# Patient Record
Sex: Female | Born: 1985 | Race: White | Hispanic: No | Marital: Married | Smoking: Never smoker
Health system: Southern US, Community
[De-identification: ages and names within clinical notes are randomized; demographics above are authoritative.]

## PROBLEM LIST (undated history)

## (undated) ENCOUNTER — Inpatient Hospital Stay (HOSPITAL_COMMUNITY): Payer: Self-pay

## (undated) DIAGNOSIS — I7300 Raynaud's syndrome without gangrene: Secondary | ICD-10-CM

## (undated) DIAGNOSIS — R51 Headache: Secondary | ICD-10-CM

## (undated) DIAGNOSIS — Z789 Other specified health status: Secondary | ICD-10-CM

## (undated) HISTORY — DX: Raynaud's syndrome without gangrene: I73.00

## (undated) HISTORY — PX: TONSILLECTOMY AND ADENOIDECTOMY: SHX28

---

## 2012-12-06 LAB — OB RESULTS CONSOLE RPR: RPR: NONREACTIVE

## 2012-12-06 LAB — OB RESULTS CONSOLE ANTIBODY SCREEN: Antibody Screen: NEGATIVE

## 2012-12-11 LAB — OB RESULTS CONSOLE GC/CHLAMYDIA
Chlamydia: NEGATIVE
Gonorrhea: NEGATIVE

## 2012-12-17 ENCOUNTER — Inpatient Hospital Stay (HOSPITAL_COMMUNITY): Admission: AD | Admit: 2012-12-17 | Payer: Self-pay | Source: Ambulatory Visit | Admitting: Obstetrics & Gynecology

## 2013-06-10 ENCOUNTER — Encounter (HOSPITAL_COMMUNITY): Payer: Self-pay

## 2013-06-10 ENCOUNTER — Inpatient Hospital Stay (HOSPITAL_COMMUNITY): Payer: 59

## 2013-06-10 ENCOUNTER — Observation Stay (HOSPITAL_COMMUNITY)
Admission: AD | Admit: 2013-06-10 | Discharge: 2013-06-11 | Disposition: A | Discharge status: Home / Self Care | Payer: 59 | Source: Ambulatory Visit | Attending: Obstetrics & Gynecology | Admitting: Obstetrics & Gynecology

## 2013-06-10 DIAGNOSIS — Z9181 History of falling: Secondary | ICD-10-CM

## 2013-06-10 DIAGNOSIS — W19XXXD Unspecified fall, subsequent encounter: Secondary | ICD-10-CM

## 2013-06-10 DIAGNOSIS — O4700 False labor before 37 completed weeks of gestation, unspecified trimester: Principal | ICD-10-CM | POA: Insufficient documentation

## 2013-06-10 DIAGNOSIS — O99891 Other specified diseases and conditions complicating pregnancy: Secondary | ICD-10-CM | POA: Insufficient documentation

## 2013-06-10 DIAGNOSIS — Y9289 Other specified places as the place of occurrence of the external cause: Secondary | ICD-10-CM | POA: Insufficient documentation

## 2013-06-10 DIAGNOSIS — O4703 False labor before 37 completed weeks of gestation, third trimester: Secondary | ICD-10-CM

## 2013-06-10 DIAGNOSIS — W010XXA Fall on same level from slipping, tripping and stumbling without subsequent striking against object, initial encounter: Secondary | ICD-10-CM | POA: Insufficient documentation

## 2013-06-10 DIAGNOSIS — Y9302 Activity, running: Secondary | ICD-10-CM | POA: Insufficient documentation

## 2013-06-10 DIAGNOSIS — W1809XA Striking against other object with subsequent fall, initial encounter: Secondary | ICD-10-CM

## 2013-06-10 HISTORY — DX: Other specified health status: Z78.9

## 2013-06-10 LAB — CBC
HCT: 38.2 % (ref 36.0–46.0)
Hemoglobin: 13.2 g/dL (ref 12.0–15.0)
MCH: 32 pg (ref 26.0–34.0)
MCHC: 34.6 g/dL (ref 30.0–36.0)
MCV: 92.5 fL (ref 78.0–100.0)
Platelets: 161 K/uL (ref 150–400)
RBC: 4.13 MIL/uL (ref 3.87–5.11)
RDW: 13.4 % (ref 11.5–15.5)
WBC: 11.1 K/uL — ABNORMAL HIGH (ref 4.0–10.5)

## 2013-06-10 MED ORDER — LACTATED RINGERS IV SOLN
INTRAVENOUS | Status: DC
  Administered 2013-06-10 – 2013-06-11 (×2): via INTRAVENOUS

## 2013-06-10 MED ORDER — DOCUSATE SODIUM 100 MG PO CAPS
100.0000 mg | ORAL_CAPSULE | Freq: Every day | ORAL | Status: DC
  Filled 2013-06-10 (×2): qty 1

## 2013-06-10 MED ORDER — ACETAMINOPHEN 325 MG PO TABS: 650.0000 mg | ORAL_TABLET | ORAL | Status: DC | PRN

## 2013-06-10 MED ORDER — PRENATAL MULTIVITAMIN CH
1.0000 | ORAL_TABLET | Freq: Every day | ORAL | Status: DC
  Administered 2013-06-10: 1 via ORAL
  Filled 2013-06-10 (×2): qty 1

## 2013-06-10 MED ORDER — LACTATED RINGERS IV SOLN
Freq: Once | INTRAVENOUS | Status: AC
  Administered 2013-06-10: 15:00:00 via INTRAVENOUS

## 2013-06-10 MED ORDER — ZOLPIDEM TARTRATE 5 MG PO TABS: 5.0000 mg | ORAL_TABLET | Freq: Every evening | ORAL | Status: DC | PRN

## 2013-06-10 MED ORDER — CALCIUM CARBONATE ANTACID 500 MG PO CHEW
2.0000 | CHEWABLE_TABLET | ORAL | Status: DC | PRN
  Filled 2013-06-10: qty 2

## 2013-06-10 NOTE — Progress Notes (Signed)
Left message, awaiting a call back

## 2013-06-10 NOTE — Progress Notes (Signed)
Melanie bhambri cnm notified of patient, her ultrasound report and tracing including ctx pattern. Will come to see patient

## 2013-06-10 NOTE — H&P (Signed)
  OB ADMISSION/ HISTORY & PHYSICAL:  Admission Date: 06/10/2013  1:59 PM  Admit Diagnosis: 35.[redacted] weeks gestation, S/p fall  Lauren Burgess is a 26 y.o. female presenting for s/p fall around 1030 this am, abdomen made contact with concrete.  Prenatal History: G1P0   Prenatal care at [facility]  Primary Ob Provider: Dr. Seymour Bars Prenatal course uncomplicated   Medical / Surgical History :  Past medical history:  Past Medical History  Diagnosis Date  . Medical history non-contributory      Past surgical history:  Past Surgical History  Procedure Laterality Date  . No past surgeries    . Tonsillectomy      Family History: History reviewed. No pertinent family history.   Social History:  reports that she has never smoked. She does not have any smokeless tobacco history on file. Her alcohol and drug histories are not on file.  Allergies: Review of patient's allergies indicates no known allergies.   Current Medications at time of admission:  Prior to Admission medications   Medication Sig Start Date End Date Taking? Authorizing Provider  Prenatal Vit-Fe Fumarate-FA (PRENATAL MULTIVITAMIN) TABS tablet Take 1 tablet by mouth at bedtime.   Yes Historical Provider, MD     Review of Systems: +FM No LOF or VB +irregular, mild ctx   Physical Exam:  VS: Blood pressure 120/75, pulse 74, temperature 98.2 F (36.8 C), temperature source Oral, resp. rate 16, SpO2 96.00%.  General: alert and oriented, appears comfortable Heart: RRR Lungs: Clear lung fields Abdomen: Gravid, soft and non-tender, non-distended / uterus: gravid, non-tender Extremities: no edema  Genitalia / VE:  closed/30/-2  FHR: baseline rate 135 / variability mod / accelerations + / no decelerations TOCO: ctx q  1-4, mild Sono done: no evidence of abruption  Assessment: 35.[redacted] weeks gestation S/p fall Preterm contractions FHR category I   Plan:  Observation x24 hrs. Continuous  fetal monitoring. Monitor for signs of abruption. Dr Cherly Hensen notified of admission / plan of care   Lawernce Pitts MSN, CNM 06/10/2013, 6:03 PM

## 2013-06-10 NOTE — MAU Note (Signed)
Patient is in for monitoring due tripping and falling onto her abdomen while jogging this morning at 10.30am. She denies any pain, vaginal bleeding or LOF. She reports good fetal movement.

## 2013-06-10 NOTE — MAU Provider Note (Signed)
  History     CSN: [id number]  Arrival date and time: 06/10/13 1359   First Provider Initiated Contact with Patient 06/10/13 1720      Chief Complaint  Patient presents with  . Fall   HPI Comments: G1 @35 .2 was jogging with dog this am at 1030 and tripped, fell, braced herself with left hand but abdomen made contact with concrete. No LOF or VB. + FM since fall. Irregular ctx, not painful. Pregnancy uncomplicated.  Fall The accident occurred 6 to 12 hours ago. The fall occurred while running. She landed on concrete. There was no blood loss. The pain is at a severity of 0/10. The patient is experiencing no pain.    OB History   Grav Para Term Preterm Abortions TAB SAB Ect Mult Living   1               Past Medical History  Diagnosis Date  . Medical history non-contributory     Past Surgical History  Procedure Laterality Date  . No past surgeries    . Tonsillectomy      History reviewed. No pertinent family history.  History  Substance Use Topics  . Smoking status: Never Smoker   . Smokeless tobacco: Not on file  . Alcohol Use: Not on file    Allergies: No Known Allergies  Prescriptions prior to admission  Medication Sig Dispense Refill  . Prenatal Vit-Fe Fumarate-FA (PRENATAL MULTIVITAMIN) TABS tablet Take 1 tablet by mouth at bedtime.        Review of Systems  Constitutional: Negative.   HENT: Negative.   Eyes: Negative.   Respiratory: Negative.   Cardiovascular: Negative.   Gastrointestinal: Negative.   Genitourinary: Negative.   Musculoskeletal: Negative.   Skin: Negative.   Neurological: Negative.   Endo/Heme/Allergies: Negative.   Psychiatric/Behavioral: Negative.    Physical Exam   Blood pressure 120/75, pulse 74, temperature 98.2 F (36.8 C), temperature source Oral, resp. rate 16, SpO2 96.00%.  Physical Exam  Constitutional: She is oriented to person, place, and time. She appears well-developed and well-nourished.  HENT:  Head:  Normocephalic.  Eyes: Pupils are equal, round, and reactive to light.  Neck: Normal range of motion.  Cardiovascular: Normal rate and regular rhythm.   Respiratory: Effort normal and breath sounds normal.  GI: Soft. Bowel sounds are normal.  Genitourinary: Vagina normal and uterus normal.  Musculoskeletal: Normal range of motion.  Neurological: She is alert and oriented to person, place, and time. She has normal reflexes.  Skin: Skin is warm and dry.  Psychiatric: She has a normal mood and affect.  SVE: 0/30/-2 EFM: FHR 135 bpm, moderate variability, +accels, no decels; Toco: q 1-4, mild  MAU Course  Procedures NST-reactive Sono: Vertex, AFI-WNL; no evidence of abruption  MDM n/a  Assessment and Plan  35.[redacted] wks gestation S/p fall Preterm contractions Fetal Tracing: Cat I   Admit to antenatal unit  x24 hours for observation of contractions. Continuous Efm. Dr. Cherly Hensen consulted with assessment and plan of care.  Lauren Burgess, N 06/10/2013, 5:50 PM

## 2013-06-10 NOTE — Progress Notes (Signed)
Patient has continued to have contractions every 1-5 minutes, feels tightness, but no associated pain. Called M. Bhambri, CNM and updated. Provider is ok with uterine activity at this time. To call MD on call if patient begins having pain.

## 2013-06-10 NOTE — Progress Notes (Signed)
Left a message, 2nd call

## 2013-06-10 NOTE — Progress Notes (Signed)
Dr cousins notified of patient, order to obtain OB ultrasound and call Donette Larry cnm

## 2013-06-11 NOTE — Progress Notes (Signed)
HD#2  35 3/7 weeks S/P fall. Pt reports occ feels any of the ctxs. (+) FM. Denies abd pain  VSS Afebrile Lungs: clear to A Cor: RRR w/o murmur Abd: gravid nontender soft. No ecchymosis Pelvic: closed/ long/post/-2 vtx Ext: no edema  Tracing: baseline 120 (+) accels to 150 Ctx q 3-70mins  IMP: IUP @ 35 3/7 weeks S./P Fall with no evidence of placental abruption or labor P) d/c home. Keep scheduled  appt tomorrow. Labor prec

## 2013-06-11 NOTE — MAU Provider Note (Signed)
Reviewed and agree with plan.

## 2013-06-11 NOTE — Progress Notes (Signed)
Pt. Is stable and ready to be discharged. All belongings are with patient. All discharge instructions given and all questions answered. I escorted patient and she walked out with husband to be discharged.

## 2013-06-12 LAB — OB RESULTS CONSOLE GBS: GBS: POSITIVE

## 2013-07-09 ENCOUNTER — Telehealth (HOSPITAL_COMMUNITY): Payer: Self-pay | Admitting: *Deleted

## 2013-07-09 ENCOUNTER — Encounter (HOSPITAL_COMMUNITY): Payer: Self-pay | Admitting: *Deleted

## 2013-07-09 NOTE — Telephone Encounter (Signed)
Preadmission screen  

## 2013-07-12 ENCOUNTER — Other Ambulatory Visit: Payer: Self-pay | Admitting: Obstetrics & Gynecology

## 2013-07-18 HISTORY — PX: HAND SURGERY: SHX662

## 2013-07-18 NOTE — L&D Delivery Note (Signed)
Delivery Note At 1:46 PM a viable female was delivered via Vaginal, Spontaneous Delivery (Presentation: Left Occiput Anterior).  APGAR: 9, 9; weight .  pending Placenta status: Intact, Spontaneous.  Cord: 3 vessels with the following complications: None.  Cord pH: not indicated  Anesthesia: Epidural  Episiotomy: R mediolateral done given low fetal baseline, no progress over 20 min of pushing and loss of variability, suspected skin dystocia. One episitomy cut, excelled progress with pushing and delivery 2 contractions later.  Lacerations: 2nd degree Suture Repair: 2.0 3.0 vicryl rapide. Episiotomy closed in standard fashion w/ 30 vicrly rapide, 3 additional deep figure of 8 sutures used to better approximate tissue, with 2-0 vicrly rapide, prior to sub-Q closure Est. Blood Loss (mL): 400  Mom to postpartum.  Baby to Couplet care / Skin to Skin.  Zyrus Hetland A. 07/19/2013, 2:15 PM

## 2013-07-19 ENCOUNTER — Inpatient Hospital Stay (HOSPITAL_COMMUNITY)
Admission: RE | Admit: 2013-07-19 | Discharge: 2013-07-19 | Disposition: A | Discharge status: Home / Self Care | Payer: 59 | Source: Ambulatory Visit | Attending: Obstetrics | Admitting: Obstetrics

## 2013-07-19 ENCOUNTER — Encounter (HOSPITAL_COMMUNITY): Payer: 59 | Admitting: Anesthesiology

## 2013-07-19 ENCOUNTER — Inpatient Hospital Stay (HOSPITAL_COMMUNITY): Payer: 59 | Admitting: Anesthesiology

## 2013-07-19 ENCOUNTER — Inpatient Hospital Stay (HOSPITAL_COMMUNITY)
Admission: AD | Admit: 2013-07-19 | Discharge: 2013-07-21 | DRG: 775 | Disposition: A | Discharge status: Home / Self Care | Payer: 59 | Source: Ambulatory Visit | Attending: Obstetrics and Gynecology | Admitting: Obstetrics and Gynecology

## 2013-07-19 ENCOUNTER — Encounter (HOSPITAL_COMMUNITY): Payer: Self-pay | Admitting: *Deleted

## 2013-07-19 DIAGNOSIS — O9912 Other diseases of the blood and blood-forming organs and certain disorders involving the immune mechanism complicating childbirth: Secondary | ICD-10-CM

## 2013-07-19 DIAGNOSIS — D689 Coagulation defect, unspecified: Secondary | ICD-10-CM | POA: Diagnosis present

## 2013-07-19 DIAGNOSIS — O480 Post-term pregnancy: Principal | ICD-10-CM | POA: Diagnosis present

## 2013-07-19 DIAGNOSIS — Z3490 Encounter for supervision of normal pregnancy, unspecified, unspecified trimester: Secondary | ICD-10-CM

## 2013-07-19 DIAGNOSIS — D696 Thrombocytopenia, unspecified: Secondary | ICD-10-CM | POA: Diagnosis present

## 2013-07-19 DIAGNOSIS — O9989 Other specified diseases and conditions complicating pregnancy, childbirth and the puerperium: Secondary | ICD-10-CM

## 2013-07-19 DIAGNOSIS — I7300 Raynaud's syndrome without gangrene: Secondary | ICD-10-CM | POA: Diagnosis present

## 2013-07-19 DIAGNOSIS — Z22330 Carrier of Group B streptococcus: Secondary | ICD-10-CM

## 2013-07-19 DIAGNOSIS — O99892 Other specified diseases and conditions complicating childbirth: Secondary | ICD-10-CM | POA: Diagnosis present

## 2013-07-19 LAB — CBC
HCT: 38.3 % (ref 36.0–46.0)
HEMOGLOBIN: 13.4 g/dL (ref 12.0–15.0)
MCH: 32.3 pg (ref 26.0–34.0)
MCHC: 35 g/dL (ref 30.0–36.0)
MCV: 92.3 fL (ref 78.0–100.0)
Platelets: 129 10*3/uL — ABNORMAL LOW (ref 150–400)
RBC: 4.15 MIL/uL (ref 3.87–5.11)
RDW: 13.1 % (ref 11.5–15.5)
WBC: 10.6 10*3/uL — ABNORMAL HIGH (ref 4.0–10.5)

## 2013-07-19 LAB — ABO/RH: ABO/RH(D): O POS

## 2013-07-19 LAB — RPR: RPR: NONREACTIVE

## 2013-07-19 MED ORDER — ONDANSETRON HCL 4 MG PO TABS: 4.0000 mg | ORAL_TABLET | ORAL | Status: DC | PRN

## 2013-07-19 MED ORDER — DIBUCAINE 1 % RE OINT: 1.0000 "application " | TOPICAL_OINTMENT | RECTAL | Status: DC | PRN

## 2013-07-19 MED ORDER — OXYCODONE-ACETAMINOPHEN 5-325 MG PO TABS: 1.0000 | ORAL_TABLET | ORAL | Status: DC | PRN

## 2013-07-19 MED ORDER — EPHEDRINE 5 MG/ML INJ
10.0000 mg | INTRAVENOUS | Status: DC | PRN
  Filled 2013-07-19: qty 4
  Filled 2013-07-19: qty 2

## 2013-07-19 MED ORDER — OXYTOCIN 40 UNITS IN LACTATED RINGERS INFUSION - SIMPLE MED
1.0000 m[IU]/min | INTRAVENOUS | Status: DC
  Administered 2013-07-19: 2 m[IU]/min via INTRAVENOUS
  Filled 2013-07-19: qty 1000

## 2013-07-19 MED ORDER — CITRIC ACID-SODIUM CITRATE 334-500 MG/5ML PO SOLN: 30.0000 mL | ORAL | Status: DC | PRN

## 2013-07-19 MED ORDER — OXYTOCIN BOLUS FROM INFUSION: 500.0000 mL | INTRAVENOUS | Status: DC

## 2013-07-19 MED ORDER — FLEET ENEMA 7-19 GM/118ML RE ENEM: 1.0000 | ENEMA | Freq: Every day | RECTAL | Status: DC | PRN

## 2013-07-19 MED ORDER — DIPHENHYDRAMINE HCL 50 MG/ML IJ SOLN: 12.5000 mg | INTRAMUSCULAR | Status: DC | PRN

## 2013-07-19 MED ORDER — FENTANYL 2.5 MCG/ML BUPIVACAINE 1/10 % EPIDURAL INFUSION (WH - ANES)
14.0000 mL/h | INTRAMUSCULAR | Status: DC | PRN
  Filled 2013-07-19: qty 125

## 2013-07-19 MED ORDER — DIPHENHYDRAMINE HCL 25 MG PO CAPS: 25.0000 mg | ORAL_CAPSULE | Freq: Four times a day (QID) | ORAL | Status: DC | PRN

## 2013-07-19 MED ORDER — IBUPROFEN 600 MG PO TABS
600.0000 mg | ORAL_TABLET | Freq: Four times a day (QID) | ORAL | Status: DC
  Administered 2013-07-19 – 2013-07-21 (×8): 600 mg via ORAL
  Filled 2013-07-19 (×9): qty 1

## 2013-07-19 MED ORDER — SENNOSIDES-DOCUSATE SODIUM 8.6-50 MG PO TABS
2.0000 | ORAL_TABLET | ORAL | Status: DC
  Administered 2013-07-19 – 2013-07-21 (×2): 2 via ORAL
  Filled 2013-07-19 (×2): qty 2

## 2013-07-19 MED ORDER — PRENATAL MULTIVITAMIN CH
1.0000 | ORAL_TABLET | Freq: Every day | ORAL | Status: DC
  Administered 2013-07-20: 1 via ORAL
  Filled 2013-07-19 (×2): qty 1

## 2013-07-19 MED ORDER — LANOLIN HYDROUS EX OINT: TOPICAL_OINTMENT | CUTANEOUS | Status: DC | PRN

## 2013-07-19 MED ORDER — PHENYLEPHRINE 40 MCG/ML (10ML) SYRINGE FOR IV PUSH (FOR BLOOD PRESSURE SUPPORT)
80.0000 ug | PREFILLED_SYRINGE | INTRAVENOUS | Status: DC | PRN
  Filled 2013-07-19: qty 2
  Filled 2013-07-19: qty 10

## 2013-07-19 MED ORDER — LACTATED RINGERS IV SOLN: 500.0000 mL | INTRAVENOUS | Status: DC | PRN

## 2013-07-19 MED ORDER — FENTANYL 2.5 MCG/ML BUPIVACAINE 1/10 % EPIDURAL INFUSION (WH - ANES)
INTRAMUSCULAR | Status: DC | PRN
  Administered 2013-07-19: 14 mL/h via EPIDURAL

## 2013-07-19 MED ORDER — EPHEDRINE 5 MG/ML INJ
10.0000 mg | INTRAVENOUS | Status: DC | PRN
  Filled 2013-07-19: qty 2

## 2013-07-19 MED ORDER — LACTATED RINGERS IV SOLN
INTRAVENOUS | Status: DC
  Administered 2013-07-19: 13:00:00 via INTRAVENOUS

## 2013-07-19 MED ORDER — PENICILLIN G POTASSIUM 5000000 UNITS IJ SOLR
5.0000 10*6.[IU] | Freq: Once | INTRAVENOUS | Status: AC
  Administered 2013-07-19: 5 10*6.[IU] via INTRAVENOUS
  Filled 2013-07-19: qty 5

## 2013-07-19 MED ORDER — BISACODYL 10 MG RE SUPP: 10.0000 mg | Freq: Every day | RECTAL | Status: DC | PRN

## 2013-07-19 MED ORDER — SIMETHICONE 80 MG PO CHEW: 80.0000 mg | CHEWABLE_TABLET | ORAL | Status: DC | PRN

## 2013-07-19 MED ORDER — LACTATED RINGERS IV SOLN
500.0000 mL | Freq: Once | INTRAVENOUS | Status: AC
  Administered 2013-07-19: 500 mL via INTRAVENOUS

## 2013-07-19 MED ORDER — ONDANSETRON HCL 4 MG/2ML IJ SOLN: 4.0000 mg | Freq: Four times a day (QID) | INTRAMUSCULAR | Status: DC | PRN

## 2013-07-19 MED ORDER — PENICILLIN G POTASSIUM 5000000 UNITS IJ SOLR
2.5000 10*6.[IU] | INTRAVENOUS | Status: DC
  Administered 2013-07-19: 2.5 10*6.[IU] via INTRAVENOUS
  Filled 2013-07-19 (×5): qty 2.5

## 2013-07-19 MED ORDER — LIDOCAINE HCL (PF) 1 % IJ SOLN
30.0000 mL | INTRAMUSCULAR | Status: DC | PRN
  Administered 2013-07-19: 30 mL
  Filled 2013-07-19 (×2): qty 30

## 2013-07-19 MED ORDER — ACETAMINOPHEN 325 MG PO TABS: 650.0000 mg | ORAL_TABLET | ORAL | Status: DC | PRN

## 2013-07-19 MED ORDER — LIDOCAINE HCL (PF) 1 % IJ SOLN
INTRAMUSCULAR | Status: DC | PRN
  Administered 2013-07-19 (×2): 5 mL

## 2013-07-19 MED ORDER — ONDANSETRON HCL 4 MG/2ML IJ SOLN: 4.0000 mg | INTRAMUSCULAR | Status: DC | PRN

## 2013-07-19 MED ORDER — IBUPROFEN 600 MG PO TABS: 600.0000 mg | ORAL_TABLET | Freq: Four times a day (QID) | ORAL | Status: DC | PRN

## 2013-07-19 MED ORDER — ZOLPIDEM TARTRATE 5 MG PO TABS: 5.0000 mg | ORAL_TABLET | Freq: Every evening | ORAL | Status: DC | PRN

## 2013-07-19 MED ORDER — TETANUS-DIPHTH-ACELL PERTUSSIS 5-2.5-18.5 LF-MCG/0.5 IM SUSP: 0.5000 mL | Freq: Once | INTRAMUSCULAR | Status: DC

## 2013-07-19 MED ORDER — BENZOCAINE-MENTHOL 20-0.5 % EX AERO
1.0000 "application " | INHALATION_SPRAY | CUTANEOUS | Status: DC | PRN
  Administered 2013-07-19: 1 via TOPICAL
  Filled 2013-07-19: qty 56

## 2013-07-19 MED ORDER — PHENYLEPHRINE 40 MCG/ML (10ML) SYRINGE FOR IV PUSH (FOR BLOOD PRESSURE SUPPORT)
80.0000 ug | PREFILLED_SYRINGE | INTRAVENOUS | Status: DC | PRN
  Filled 2013-07-19: qty 10
  Filled 2013-07-19: qty 2

## 2013-07-19 MED ORDER — OXYTOCIN 40 UNITS IN LACTATED RINGERS INFUSION - SIMPLE MED
62.5000 mL/h | INTRAVENOUS | Status: DC
  Administered 2013-07-19: 62.5 mL/h via INTRAVENOUS

## 2013-07-19 MED ORDER — WITCH HAZEL-GLYCERIN EX PADS: 1.0000 "application " | MEDICATED_PAD | CUTANEOUS | Status: DC | PRN

## 2013-07-19 MED ORDER — TERBUTALINE SULFATE 1 MG/ML IJ SOLN: 0.2500 mg | Freq: Once | INTRAMUSCULAR | Status: DC | PRN

## 2013-07-19 NOTE — Progress Notes (Signed)
Lauren Burgess is a 28 y.o. G1P0 at [redacted]w[redacted]d by LMP admitted for induction of labor due to Post dates. Due date 12/27.  Subjective: Uncomfortable.  Objective: BP 138/85  Pulse 61  Temp(Src) 97.8 F (36.6 C) (Oral)  Resp 18  Ht 5\' 3"  (1.6 m)  Wt 61.236 kg (135 lb)  BMI 23.92 kg/m2      FHT:  FHR: 145 bpm, variability: moderate,  accelerations:  Present,  decelerations:  Absent UC:   regular, every 4 minutes SVE:   Dilation: 4 Effacement (%): 100 Station: -1 Exam by:: Quadir Muns AROM- clear  Labs: Lab Results  Component Value Date   WBC 11.1* 06/10/2013   HGB 13.2 06/10/2013   HCT 38.2 06/10/2013   MCV 92.5 06/10/2013   PLT 161 06/10/2013    Assessment / Plan: Induction of labor due to postterm,  progressing well on pitocin  Labor: Progressing normally Preeclampsia:  no signs or symptoms of toxicity Fetal Wellbeing:  Category I Pain Control:  Labor support without medications I/D:  n/a Anticipated MOD:  NSVD  Guy Seese J 07/19/2013, 9:20 AM

## 2013-07-19 NOTE — Anesthesia Preprocedure Evaluation (Signed)

## 2013-07-19 NOTE — H&P (Signed)
Lauren Burgess is a 28 y.o. G1P0 at [redacted]w[redacted]d presenting for post-dates IOL. Pt notes rare contractions. Good fetal movement, No vaginal bleeding, not leaking fluid.  PNCare at [facility] since 1st trimester. - GBS pos - thrombocytopenia, stable in low 100;s   Prenatal Transfer Tool  Maternal Diabetes: No Genetic Screening: Normal Maternal Ultrasounds/Referrals: Normal Fetal Ultrasounds or other Referrals:  None Maternal Substance Abuse:  No Significant Maternal Medications:  None Significant Maternal Lab Results: None     OB History   Grav Para Term Preterm Abortions TAB SAB Ect Mult Living   1              Past Medical History  Diagnosis Date  . Medical history non-contributory   . Raynaud disease    Past Surgical History  Procedure Laterality Date  . No past surgeries    . Tonsillectomy     Family History: family history includes Cancer in her maternal grandfather and maternal grandmother; Heart attack in her maternal grandfather; Hypertension in her maternal grandfather. Social History:  reports that she has never smoked. She has never used smokeless tobacco. She reports that she does not drink alcohol or use illicit drugs.  Review of Systems - Negative except discomfort of pregnancy   Dilation: 10 Effacement (%): 100 Station: +3 Exam by:: [email or web address],WU Blood pressure 96/54, pulse 56, temperature 97.8 F (36.6 C), temperature source Oral, resp. rate 18, height 5\' 3"  (1.6 m), weight 61.236 kg (135 lb), SpO2 100.00%.  Physical Exam: pt not seen by me upon admission- exam reflects at time of first encounter Gen: well appearing, no distress, pushing with contractions  Abd: gravid, NT, no RUQ pain LE: no edema, equal bilaterally, non-tender Toco: q 3-5 min, pit at 1munits/ min FH: baseline 110's, accelerations present, no deceleratons, 10 beat variability  Prenatal labs: ABO, Rh: O/Positive/-- (05/22 0000) Antibody: Negative (05/22 0000) Rubella:   immune RPR: Nonreactive (05/22 0000)  HBsAg: Negative (05/22 0000)  HIV: Non-reactive (05/22 0000)  GBS: Positive (11/26 0000)  1 hr Glucola per OB flow  Genetic screening per OB flow Anatomy US nl   Assessment/Plan: 28 y.o. G1P0 at [redacted]w[redacted]d IOL for post-dates. S/p AROM/ pitocin- great progress, expect delivery Reactive fetal testing Epidural   Lauren Burgess A. 07/19/2013, 2:11 PM

## 2013-07-19 NOTE — Anesthesia Procedure Notes (Signed)
Epidural Patient location during procedure: OB Start time: 07/19/2013 9:44 AM  Staffing Anesthesiologist: Rudean Curt Performed by: anesthesiologist   Preanesthetic Checklist Completed: patient identified, site marked, surgical consent, pre-op evaluation, timeout performed, IV checked, risks and benefits discussed and monitors and equipment checked  Epidural Patient position: sitting Prep: site prepped and draped and DuraPrep Patient monitoring: continuous pulse ox and blood pressure Approach: midline Injection technique: LOR air  Needle:  Needle type: Tuohy  Needle gauge: 17 G Needle length: 9 cm and 9 Needle insertion depth: 5 cm cm Catheter type: closed end flexible Catheter size: 19 Gauge Catheter at skin depth: 10 cm Test dose: negative  Assessment Events: blood not aspirated, injection not painful, no injection resistance, negative IV test and no paresthesia  Additional Notes Patient identified.  Risk benefits discussed including failed block, incomplete pain control, headache, nerve damage, paralysis, blood pressure changes, nausea, vomiting, reactions to medication both toxic or allergic, and postpartum back pain.  Patient expressed understanding and wished to proceed.  All questions were answered.  Sterile technique used throughout procedure and epidural site dressed with sterile barrier dressing. No paresthesia or other complications noted.The patient did not experience any signs of intravascular injection such as tinnitus or metallic taste in mouth nor signs of intrathecal spread such as rapid motor block. Please see nursing notes for vital signs.

## 2013-07-19 NOTE — Lactation Note (Signed)
This note was copied from the chart of [place]. Lactation Consultation Note  Patient Name: Boy Aleana Fifita [id number] Date: 07/19/2013 Reason for consult: Initial assessment;Difficult latch;Breast/nipple pain on (L) nipple due to shallow latch.  (L) nipple inflamed but everted and after breastfeeding, nipple remains intact and symmetrical.  Mom reports a soreness level of "4" on this side when assisted with deeper latch.  LC demonstrated hand expression, cross-cradle and chin tug technique to ensure deeper latch and wide areolar grasp.  FOB also shown how to assist and baby nursed with several re-latches for total of 12 minutes.  Swallows heard at intervals. 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  LC encouraged cue feedings, STS and use of chin tug as needed, especially on (L) side.  Assessment and interventions reviewed with RN's, Janett Billow and Dianna.  LC provided comfort gelpads for use on nipples between feedings after applying ebm. LC encouraged review of Baby and Me pp 9, 14 and 20-25 for STS and BF information. LC provided [employer] brochure and reviewed [facility] services and list of community and web site resources.      Maternal Data Formula Feeding for Exclusion: No Infant to breast within first hour of birth: Yes (initial LATCH score=7 due to needing help and no swallows noted) Has patient been taught Hand Expression?: Yes (LC reviewed and assisted with latch) Does the patient have breastfeeding experience prior to this delivery?: No  Feeding Feeding Type: Breast Fed Length of feed: 12 min  LATCH Score/Interventions Latch: Grasps breast easily, tongue down, lips flanged, rhythmical sucking. (opens wider with less nipple discomfort w/chin tug) Intervention(s): Adjust position;Assist with latch;Breast compression  Audible Swallowing: A few with stimulation Intervention(s): Alternate breast  massage;Hand expression;Skin to skin  Type of Nipple: Everted at rest and after stimulation  Comfort (Breast/Nipple): Filling, red/small blisters or bruises, mild/mod discomfort  Problem noted: Severe discomfort;Mild/Moderate discomfort (improved soreness with deeper latch) Interventions (Mild/moderate discomfort): Hand expression;Comfort gels  Hold (Positioning): Assistance needed to correctly position infant at breast and maintain latch. Intervention(s): Breastfeeding basics reviewed;Support Pillows;Position options;Skin to skin (demonstrated chin tug and cross-cradle)  LATCH Score: 7 (LC assist and later, mom re-latched on her own)  Lactation Tools Discussed/Used Tools: Comfort gels Pump Review: Milk Storage Initiated by:: provided by insurance carrier and received from [facility] store Date initiated::  (no need for pumping at this time)   Consult Status Consult Status: Follow-up Date: 07/20/13 Follow-up type: In-patient    Junious Dresser [facility] 07/19/2013, 11:23 PM

## 2013-07-20 LAB — CBC
HEMATOCRIT: 33.8 % — AB (ref 36.0–46.0)
Hemoglobin: 11.7 g/dL — ABNORMAL LOW (ref 12.0–15.0)
MCH: 32.2 pg (ref 26.0–34.0)
MCHC: 34.6 g/dL (ref 30.0–36.0)
MCV: 93.1 fL (ref 78.0–100.0)
PLATELETS: 114 10*3/uL — AB (ref 150–400)
RBC: 3.63 MIL/uL — ABNORMAL LOW (ref 3.87–5.11)
RDW: 13.1 % (ref 11.5–15.5)
WBC: 10.9 10*3/uL — ABNORMAL HIGH (ref 4.0–10.5)

## 2013-07-20 NOTE — Anesthesia Postprocedure Evaluation (Signed)
  Anesthesia Post-op Note  Patient: Lauren Burgess  Procedure(s) Performed: * No procedures listed *  Patient Location: PACU and Mother/Baby  Anesthesia Type:Epidural  Level of Consciousness: awake, alert  and oriented  Airway and Oxygen Therapy: Patient Spontanous Breathing  Post-op Pain: none  Post-op Assessment: Post-op Vital signs reviewed, Patient's Cardiovascular Status Stable, No headache, No backache, No residual numbness and No residual motor weakness  Post-op Vital Signs: Reviewed and stable  Complications: No apparent anesthesia complications

## 2013-07-20 NOTE — Progress Notes (Signed)
PPD 1 SVD  S:  Reports feeling well - tired and sore/ tailbone especially sore             Tolerating po/ No nausea or vomiting             Bleeding is light             Pain controlled with motrin and percocet             Up ad lib / ambulatory without limitation or pain in sacrum /voiding QS  Newborn breast feeding  / Circumcision planne O:               VS: BP 108/72  Pulse 52  Temp(Src) 98.1 F (36.7 C) (Oral)  Resp 18  Ht 5\' 3"  (1.6 m)  Wt 61.236 kg (135 lb)  BMI 23.92 kg/m2  SpO2 100%   LABS:              Recent Labs  07/19/13 0705 07/20/13 0640  WBC 10.6* 10.9*  HGB 13.4 11.7*  PLT 129* 114*               Blood type: --/--/O POS (01/02 8413)  Rubella: Immune (05/22 0000)                     I&O: Intake/Output     01/02 0701 - 01/03 0700 01/03 0701 - 01/04 0700   Urine (mL/kg/hr) 400 (0.3)    Blood 400 (0.3)    Total Output 800     Net -800                        Physical Exam:             Alert and oriented X3  Lungs: Clear and unlabored  Heart: regular rate and rhythm / no mumurs  Abdomen: soft, non-tender, non-distended              Fundus: firm, non-tender, U-1  Perineum: moderate edema - ice pack in place  Lochia: light  Extremities: no edema, no calf pain or tenderness    A: PPD # 1              Tailbone pain - no mobility limitation  Doing well - stable status  P: Routine post partum orders  Instructed to avoid prolonged sitting on tailbone to reduce pressure and dependent edema to site                        Ice to area x 24 hours then low heat topically to sacral region / use NSAIDS routinely x 1 week                        Will need evaluation if pain worsens or not improving in 1 week - instructed to call office then            [place], [place], MSN, [facility], [place] 07/20/2013, 11:08 AM

## 2013-07-21 MED ORDER — IBUPROFEN 600 MG PO TABS: 600.0000 mg | ORAL_TABLET | Freq: Four times a day (QID) | ORAL | Status: DC

## 2013-07-21 MED ORDER — OXYCODONE-ACETAMINOPHEN 5-325 MG PO TABS
1.0000 | ORAL_TABLET | ORAL | Status: DC | PRN
Start: 2013-07-21 — End: 2013-12-10

## 2013-07-21 NOTE — Progress Notes (Signed)
PPD 2 SVD  S:  Reports feeling better today - swelling down and pain in tailbone improving - just sore             Tolerating po/ No nausea or vomiting             Bleeding is light             Pain controlled with motrin and percocet             Up ad lib / ambulatory / voiding QS  Newborn breast feeding  / Circumcision planned today O:               VS: BP 103/64  Pulse 49  Temp(Src) 98.2 F (36.8 C) (Oral)  Resp 18  Ht 5\' 3"  (1.6 m)  Wt 61.236 kg (135 lb)  BMI 23.92 kg/m2  SpO2 100%              Physical Exam:             Alert and oriented X3  Abdomen: soft, non-tender, non-distended              Fundus: firm, non-tender, U-2  Perineum: decreased edema  Lochia: light-spotting  Extremities: no edema, no calf pain or tenderness    A: PPD # 2   Doing well - stable status  P: Routine post partum orders  DC home after newborn circ today             WOB booklet - instructions reviewed to call  Artelia Laroche CNM, MSN, [facility] 07/21/2013, 10:13 AM

## 2013-07-27 NOTE — Discharge Summary (Signed)
DISCHARGE SUMMARY:  Patient ID: Lauren Burgess MRN: [id number] DOB/AGE: 1985-10-05 28 y.o.  Admit date: 07/19/2013 Admission Diagnoses: labor induction - post dates / thrombocytopenia  Discharge date: 07/21/2013  Discharge Diagnoses: PPD 2 s/p SVD  Prenatal history: G1P1001   EDC : 07/13/2013, by Other Basis  Prenatal care at [facility]  Primary provider : Pamala Burgess Prenatal course complicated with + GBS / thrombocytopenia / post dates  Prenatal Labs: ABO, Rh: --/--/O POS (01/02 3474)  Antibody: Negative (05/22 0000) Rubella: Immune (05/22 0000)   RPR: NON REACTIVE (01/02 0705)  HBsAg: Negative (05/22 0000)  HIV: Non-reactive (05/22 0000)  GTT : NL GBS: Positive (11/26 0000)   Medical / Surgical History :  Past medical history:  Past Medical History  Diagnosis Date  . Medical history non-contributory   . Raynaud disease     Past surgical history:  Past Surgical History  Procedure Laterality Date  . No past surgeries    . Tonsillectomy      Family History:  Family History  Problem Relation Age of Onset  . Cancer Maternal Grandmother     breast  . Cancer Maternal Grandfather     prostate, pancreatic  . Heart attack Maternal Grandfather   . Hypertension Maternal Grandfather     Social History:  reports that she has never smoked. She has never used smokeless tobacco. She reports that she does not drink alcohol or use illicit drugs.  Allergies: Review of patient's allergies indicates no known allergies.   Current Medications at time of admission:  Prior to Admission medications   Medication Sig Start Date End Date Taking? Authorizing Provider  Prenatal Vit-Fe Fumarate-FA (PRENATAL MULTIVITAMIN) TABS tablet Take 1 tablet by mouth at bedtime.   Yes Historical Provider, MD     Intrapartum Course:  Admit for induction labor with labor progression to complete dilation with normal labor curve Pain management: epidural Complicated by: low FHR  baseline Interventions required: episiotomy necessary to facilitate delivery  Procedures: Cesarean section delivery on 07/19/2013 with delivery of  female newborn by Dr Lauren Burgess  See operative report for further details APGAR (1 MIN): 9   APGAR (5 MINS): 9    Postoperative / postpartum course:   discharge on PPD 2 with some moderate pain in coccyx and rectum   Physical Exam:   General: ambulatory Heart: RRR Lungs: clear  Abdomen: soft and non-tender / non-distended / active BS  Extremities: no edema / negative Homans  Fundus firm and 1 F below perineum:  Mild edema / no erythema / no ecchymosis / no drainage  Discharge Instructions:  Discharged Condition: stable  Activity: pelvic rest and postoperative restrictions x 2   Diet: routine  Medications:    Medication List         ibuprofen 600 MG tablet  Commonly known as:  ADVIL,MOTRIN  Take 1 tablet (600 mg total) by mouth every 6 (six) hours.     oxyCODONE-acetaminophen 5-325 MG per tablet  Commonly known as:  PERCOCET/ROXICET  Take 1 tablet by mouth every 4 (four) hours as needed for severe pain (moderate - severe pain).     prenatal multivitamin Tabs tablet  Take 1 tablet by mouth at bedtime.        Postpartum Instructions: [facility] discharge booklet - instructions reviewed  Discharge to: Home  Follow up :   [facility] in 6 weeks for routine postpartum visit with Lauren Burgess  Signed: Artelia Laroche CNM, MSN, [place] 07/27/2013, 2:38 PM  Please note ERROR above- pt did not have cesarean delivery. Pt had SVD after episiotomy cut.   Tisa Weisel A. 07/28/2013 8:28 PM

## 2013-08-09 ENCOUNTER — Ambulatory Visit (HOSPITAL_COMMUNITY)
Admission: RE | Admit: 2013-08-09 | Discharge: 2013-08-09 | Disposition: A | Discharge status: Home / Self Care | Payer: 59 | Source: Ambulatory Visit | Attending: Obstetrics and Gynecology | Admitting: Obstetrics and Gynecology

## 2013-08-09 NOTE — Lactation Note (Signed)
Adult Lactation Consultation Outpatient Visit Note: Mom here for evaluation of latch due to sore nipple on left breast. Reports crack near tip of nipple that hurts especially at the initial latch. Both nipples slightly pink, Left nipple with crack as mom reported.  Patient Name: Lauren Burgess Date of Birth: 13-Dec-1985 Gestational Age at Delivery: Unknown Type of Delivery:   Breastfeeding History: Frequency of Breastfeeding: Q 2-3 hrs- one 4 hour stretch at night Length of Feeding: 15-30 Voids: QS- had void while here for appointment Stools: QS  Supplementing / Method: Pumping:  Type of Pump:   Frequency:  Volume:    Comments:    Consultation Evaluation:  Initial Feeding Assessment: Pre-feed Weight: 8- 13.4  4008g Post-feed Weight: 8- 15.5  4068g Amount Transferred: 60 Comments: Mom reports that the right breast feels the fullest. Mom doing great job with latch to this breast. Elijah latched well and nursed for 10 minutes with lots of swallows noted.   Additional Feeding Assessment: Pre-feed Weight: 8- 15.5 4068g Post-feed Weight: 9- 0.5  4098g Amount Transferred:30 Comments: Elijah latched well but needed bottom lip adjusted. After adjustment mom reports that it feels fine. Mom has been doing more nursing on the right breast so this breast isn't as full. After 5 minutes Elijah begins wiggling and pulling at the breast. Unlatched him and burped him again. Offered that breast again but he would not latch on.   Total Breast milk Transferred this Visit: 90 Total Supplement Given: 0  Additional Interventions: Mom has been using lanolin. Offered comfort gels for healing-mom agreed. Reviewed instructions for use of comfort gels. Going back to work when baby is 85 months old. Asking about pumping and bottle feeding EBM- Reviewed pumping schedule and how to introduce a bottle. No further questions at present. To call prn  Follow-Up  With Ped    Truddie Crumble 08/09/2013,  9:33 AM

## 2013-12-10 ENCOUNTER — Encounter (HOSPITAL_COMMUNITY): Payer: Self-pay | Admitting: Pharmacy Technician

## 2013-12-11 ENCOUNTER — Other Ambulatory Visit: Payer: Self-pay | Admitting: Orthopedic Surgery

## 2013-12-12 ENCOUNTER — Encounter (HOSPITAL_COMMUNITY): Payer: Self-pay | Admitting: *Deleted

## 2013-12-12 MED ORDER — CHLORHEXIDINE GLUCONATE 4 % EX LIQD
60.0000 mL | Freq: Once | CUTANEOUS | Status: DC
  Filled 2013-12-12: qty 60

## 2013-12-12 MED ORDER — CEFAZOLIN SODIUM-DEXTROSE 2-3 GM-% IV SOLR
2.0000 g | INTRAVENOUS | Status: AC
  Administered 2013-12-13: 2 g via INTRAVENOUS
  Filled 2013-12-12: qty 50

## 2013-12-12 NOTE — Progress Notes (Signed)
Pt made aware to Stop taking Aspirin,vitamins and herbal medications. Do not take any NSAIDs ie: Ibuprofen, Advil, Naproxen or any medication containing Aspirin.

## 2013-12-13 ENCOUNTER — Encounter (HOSPITAL_COMMUNITY): Payer: Self-pay | Admitting: *Deleted

## 2013-12-13 ENCOUNTER — Ambulatory Visit (HOSPITAL_COMMUNITY)
Admission: RE | Admit: 2013-12-13 | Discharge: 2013-12-13 | Disposition: A | Discharge status: Home / Self Care | Payer: 59 | Source: Ambulatory Visit | Attending: Orthopedic Surgery | Admitting: Orthopedic Surgery

## 2013-12-13 ENCOUNTER — Encounter (HOSPITAL_COMMUNITY): Payer: 59 | Admitting: Anesthesiology

## 2013-12-13 ENCOUNTER — Ambulatory Visit (HOSPITAL_COMMUNITY): Payer: 59 | Admitting: Anesthesiology

## 2013-12-13 ENCOUNTER — Encounter (HOSPITAL_COMMUNITY)
Admission: RE | Disposition: A | Discharge status: Home / Self Care | Payer: Self-pay | Source: Ambulatory Visit | Attending: Orthopedic Surgery

## 2013-12-13 DIAGNOSIS — I7300 Raynaud's syndrome without gangrene: Secondary | ICD-10-CM | POA: Insufficient documentation

## 2013-12-13 DIAGNOSIS — S65509A Unspecified injury of blood vessel of unspecified finger, initial encounter: Secondary | ICD-10-CM | POA: Insufficient documentation

## 2013-12-13 DIAGNOSIS — IMO0002 Reserved for concepts with insufficient information to code with codable children: Secondary | ICD-10-CM

## 2013-12-13 DIAGNOSIS — I739 Peripheral vascular disease, unspecified: Secondary | ICD-10-CM | POA: Insufficient documentation

## 2013-12-13 DIAGNOSIS — S61209A Unspecified open wound of unspecified finger without damage to nail, initial encounter: Secondary | ICD-10-CM | POA: Insufficient documentation

## 2013-12-13 DIAGNOSIS — Z79899 Other long term (current) drug therapy: Secondary | ICD-10-CM | POA: Insufficient documentation

## 2013-12-13 DIAGNOSIS — R51 Headache: Secondary | ICD-10-CM | POA: Insufficient documentation

## 2013-12-13 DIAGNOSIS — X58XXXA Exposure to other specified factors, initial encounter: Secondary | ICD-10-CM | POA: Insufficient documentation

## 2013-12-13 HISTORY — DX: Headache: R51

## 2013-12-13 HISTORY — PX: WOUND EXPLORATION: SHX6188

## 2013-12-13 LAB — CBC
HEMATOCRIT: 40.3 % (ref 36.0–46.0)
Hemoglobin: 13.6 g/dL (ref 12.0–15.0)
MCH: 31.6 pg (ref 26.0–34.0)
MCHC: 33.7 g/dL (ref 30.0–36.0)
MCV: 93.7 fL (ref 78.0–100.0)
Platelets: 187 10*3/uL (ref 150–400)
RBC: 4.3 MIL/uL (ref 3.87–5.11)
RDW: 13.3 % (ref 11.5–15.5)
WBC: 5.9 10*3/uL (ref 4.0–10.5)

## 2013-12-13 SURGERY — WOUND EXPLORATION
Anesthesia: General | Site: Hand | Laterality: Left

## 2013-12-13 MED ORDER — HYDROMORPHONE HCL PF 1 MG/ML IJ SOLN
0.2500 mg | INTRAMUSCULAR | Status: DC | PRN
Start: 2013-12-13 — End: 2013-12-13
  Administered 2013-12-13: 0.25 mg via INTRAVENOUS

## 2013-12-13 MED ORDER — FENTANYL CITRATE 0.05 MG/ML IJ SOLN
INTRAMUSCULAR | Status: AC
  Filled 2013-12-13: qty 5

## 2013-12-13 MED ORDER — OXYCODONE HCL 5 MG PO TABS
5.0000 mg | ORAL_TABLET | Freq: Once | ORAL | Status: AC | PRN
  Administered 2013-12-13: 5 mg via ORAL

## 2013-12-13 MED ORDER — ONDANSETRON HCL 4 MG/2ML IJ SOLN
INTRAMUSCULAR | Status: AC
  Filled 2013-12-13: qty 2

## 2013-12-13 MED ORDER — PROPOFOL 10 MG/ML IV BOLUS
INTRAVENOUS | Status: DC | PRN
  Administered 2013-12-13: 200 mg via INTRAVENOUS

## 2013-12-13 MED ORDER — BUPIVACAINE HCL (PF) 0.25 % IJ SOLN
INTRAMUSCULAR | Status: AC
  Filled 2013-12-13: qty 30

## 2013-12-13 MED ORDER — SODIUM CHLORIDE 0.9 % IR SOLN
Status: DC | PRN
  Administered 2013-12-13: 1000 mL

## 2013-12-13 MED ORDER — PROMETHAZINE HCL 25 MG/ML IJ SOLN: 6.2500 mg | INTRAMUSCULAR | Status: DC | PRN

## 2013-12-13 MED ORDER — BUPIVACAINE HCL (PF) 0.25 % IJ SOLN
INTRAMUSCULAR | Status: DC | PRN
  Administered 2013-12-13: 30 mL

## 2013-12-13 MED ORDER — LIDOCAINE HCL (CARDIAC) 20 MG/ML IV SOLN
INTRAVENOUS | Status: AC
  Filled 2013-12-13: qty 5

## 2013-12-13 MED ORDER — ONDANSETRON HCL 4 MG/2ML IJ SOLN
INTRAMUSCULAR | Status: DC | PRN
  Administered 2013-12-13: 4 mg via INTRAVENOUS

## 2013-12-13 MED ORDER — HYDROMORPHONE HCL PF 1 MG/ML IJ SOLN
INTRAMUSCULAR | Status: AC
  Administered 2013-12-13: 0.25 mg via INTRAVENOUS
  Filled 2013-12-13: qty 1

## 2013-12-13 MED ORDER — LACTATED RINGERS IV SOLN
INTRAVENOUS | Status: DC | PRN
  Administered 2013-12-13: 08:00:00 via INTRAVENOUS

## 2013-12-13 MED ORDER — LACTATED RINGERS IV SOLN
INTRAVENOUS | Status: DC
  Administered 2013-12-13: 08:00:00 via INTRAVENOUS

## 2013-12-13 MED ORDER — PROPOFOL 10 MG/ML IV BOLUS
INTRAVENOUS | Status: AC
  Filled 2013-12-13: qty 20

## 2013-12-13 MED ORDER — OXYCODONE-ACETAMINOPHEN 5-325 MG PO TABS: 1.0000 | ORAL_TABLET | ORAL | Status: DC | PRN

## 2013-12-13 MED ORDER — FENTANYL CITRATE 0.05 MG/ML IJ SOLN
INTRAMUSCULAR | Status: DC | PRN
  Administered 2013-12-13 (×2): 50 ug via INTRAVENOUS

## 2013-12-13 MED ORDER — OXYCODONE HCL 5 MG/5ML PO SOLN: 5.0000 mg | Freq: Once | ORAL | Status: AC | PRN

## 2013-12-13 MED ORDER — MIDAZOLAM HCL 5 MG/5ML IJ SOLN
INTRAMUSCULAR | Status: DC | PRN
  Administered 2013-12-13: 1 mg via INTRAVENOUS

## 2013-12-13 MED ORDER — LIDOCAINE HCL (CARDIAC) 20 MG/ML IV SOLN
INTRAVENOUS | Status: DC | PRN
  Administered 2013-12-13: 80 mg via INTRAVENOUS

## 2013-12-13 MED ORDER — DEXTROSE 5 % IV SOLN
INTRAVENOUS | Status: DC | PRN
  Administered 2013-12-13: 09:00:00 via INTRAVENOUS

## 2013-12-13 MED ORDER — MIDAZOLAM HCL 2 MG/2ML IJ SOLN
INTRAMUSCULAR | Status: AC
  Filled 2013-12-13: qty 2

## 2013-12-13 MED ORDER — OXYCODONE HCL 5 MG PO TABS
ORAL_TABLET | ORAL | Status: AC
  Administered 2013-12-13: 5 mg via ORAL
  Filled 2013-12-13: qty 1

## 2013-12-13 SURGICAL SUPPLY — 51 items
BANDAGE ELASTIC 3 VELCRO ST LF (GAUZE/BANDAGES/DRESSINGS) ×3 IMPLANT
BANDAGE ELASTIC 4 VELCRO ST LF (GAUZE/BANDAGES/DRESSINGS) ×3 IMPLANT
BANDAGE GAUZE ELAST BULKY 4 IN (GAUZE/BANDAGES/DRESSINGS) ×3 IMPLANT
BNDG CMPR 9X4 STRL LF SNTH (GAUZE/BANDAGES/DRESSINGS) ×1
BNDG ESMARK 4X9 LF (GAUZE/BANDAGES/DRESSINGS) ×3 IMPLANT
CORDS BIPOLAR (ELECTRODE) ×3 IMPLANT
COVER SURGICAL LIGHT HANDLE (MISCELLANEOUS) ×6 IMPLANT
CUFF TOURNIQUET SINGLE 18IN (TOURNIQUET CUFF) ×3 IMPLANT
DRAPE SURG 17X23 STRL (DRAPES) ×3 IMPLANT
DRSG PAD ABDOMINAL 8X10 ST (GAUZE/BANDAGES/DRESSINGS) ×6 IMPLANT
DURAPREP 26ML APPLICATOR (WOUND CARE) ×3 IMPLANT
GAUZE XEROFORM 1X8 LF (GAUZE/BANDAGES/DRESSINGS) ×3 IMPLANT
GLOVE BIO SURGEON STRL SZ7.5 (GLOVE) ×3 IMPLANT
GLOVE BIO SURGEON STRL SZ8.5 (GLOVE) ×3 IMPLANT
GLOVE BIOGEL PI IND STRL 7.0 (GLOVE) ×3 IMPLANT
GLOVE BIOGEL PI IND STRL 7.5 (GLOVE) ×1 IMPLANT
GLOVE BIOGEL PI INDICATOR 7.0 (GLOVE) ×6
GLOVE BIOGEL PI INDICATOR 7.5 (GLOVE) ×2
GLOVE SURG SS PI 7.0 STRL IVOR (GLOVE) ×9 IMPLANT
GOWN STRL REUS W/ TWL LRG LVL3 (GOWN DISPOSABLE) ×3 IMPLANT
GOWN STRL REUS W/ TWL XL LVL3 (GOWN DISPOSABLE) ×1 IMPLANT
GOWN STRL REUS W/TWL LRG LVL3 (GOWN DISPOSABLE) ×9
GOWN STRL REUS W/TWL XL LVL3 (GOWN DISPOSABLE) ×3
KIT BASIN OR (CUSTOM PROCEDURE TRAY) ×3 IMPLANT
KIT ROOM TURNOVER OR (KITS) ×3 IMPLANT
NEEDLE HYPO 25GX1X1/2 BEV (NEEDLE) ×3 IMPLANT
NEEDLE HYPO 25X1 1.5 SAFETY (NEEDLE) ×3 IMPLANT
NS IRRIG 1000ML POUR BTL (IV SOLUTION) ×3 IMPLANT
PACK ORTHO EXTREMITY (CUSTOM PROCEDURE TRAY) ×3 IMPLANT
PAD ARMBOARD 7.5X6 YLW CONV (MISCELLANEOUS) ×6 IMPLANT
PAD CAST 4YDX4 CTTN HI CHSV (CAST SUPPLIES) ×2 IMPLANT
PADDING CAST ABS 4INX4YD NS (CAST SUPPLIES) ×2
PADDING CAST ABS COTTON 4X4 ST (CAST SUPPLIES) ×1 IMPLANT
PADDING CAST COTTON 4X4 STRL (CAST SUPPLIES) ×6
PROTEXIS PI GLOVE ×9 IMPLANT
SPLINT PLASTER EXTRA FAST 3X15 (CAST SUPPLIES) ×2
SPLINT PLASTER GYPS XFAST 3X15 (CAST SUPPLIES) ×1 IMPLANT
SPONGE GAUZE 4X4 12PLY (GAUZE/BANDAGES/DRESSINGS) ×3 IMPLANT
SPONGE LAP 18X18 X RAY DECT (DISPOSABLE) ×3 IMPLANT
SUCTION FRAZIER TIP 10 FR DISP (SUCTIONS) ×3 IMPLANT
SUT ETHILON 4 0 PS 2 18 (SUTURE) ×3 IMPLANT
SUT ETHILON 9 0 BV130 4 (SUTURE) ×3 IMPLANT
SUT VICRYL RAPIDE 4/0 PS 2 (SUTURE) ×3 IMPLANT
SYR 20CC LL (SYRINGE) IMPLANT
SYR CONTROL 10ML LL (SYRINGE) ×3 IMPLANT
TOWEL OR 17X24 6PK STRL BLUE (TOWEL DISPOSABLE) ×3 IMPLANT
TOWEL OR 17X26 10 PK STRL BLUE (TOWEL DISPOSABLE) ×3 IMPLANT
TUBE CONNECTING 12'X1/4 (SUCTIONS) ×1
TUBE CONNECTING 12X1/4 (SUCTIONS) ×2 IMPLANT
UNDERPAD 30X30 INCONTINENT (UNDERPADS AND DIAPERS) ×3 IMPLANT
YANKAUER SUCT BULB TIP NO VENT (SUCTIONS) ×3 IMPLANT

## 2013-12-13 NOTE — H&P (Signed)
Lauren Burgess is an 28 y.o. female.   Chief Complaint: left hand numbness HPI: as above s/p volar laceration left hand  Past Medical History  Diagnosis Date  . Medical history non-contributory   . Raynaud disease   . OIZTIWPY(099.8)     Past Surgical History  Procedure Laterality Date  . No past surgeries    . Tonsillectomy    . Tonsillectomy and adenoidectomy      Family History  Problem Relation Age of Onset  . Cancer Maternal Grandmother     breast  . Cancer Maternal Grandfather     prostate, pancreatic  . Heart attack Maternal Grandfather   . Hypertension Maternal Grandfather    Social History:  reports that she has never smoked. She has never used smokeless tobacco. She reports that she drinks alcohol. She reports that she does not use illicit drugs.  Allergies: No Known Allergies  Medications Prior to Admission  Medication Sig Dispense Refill  . cephALEXin (KEFLEX) 500 MG capsule Take 500 mg by mouth 4 (four) times daily.      Marland Kitchen ibuprofen (ADVIL,MOTRIN) 200 MG tablet Take 200-400 mg by mouth every 6 (six) hours as needed for headache.      . Prenatal Vit-Fe Fumarate-FA (PRENATAL MULTIVITAMIN) TABS tablet Take 1 tablet by mouth at bedtime.        No results found for this or any previous visit (from the past 48 hour(s)). No results found.  Review of Systems  All other systems reviewed and are negative.   Blood pressure 109/55, pulse 58, temperature 97.7 F (36.5 C), temperature source Oral, resp. rate 20, height 5\' 3"  (1.6 m), weight 52.164 kg (115 lb), SpO2 100.00%, unknown if currently breastfeeding. Physical Exam  Constitutional: She is oriented to person, place, and time. She appears well-developed and well-nourished.  HENT:  Head: Normocephalic and atraumatic.  Cardiovascular: Normal rate.   Respiratory: Effort normal.  Musculoskeletal:       Left hand: She exhibits tenderness and laceration. Decreased sensation noted. Decreased sensation is  present in the medial distribution.  Volar hand laceration with decreased sensation in long and ring fingers  Neurological: She is alert and oriented to person, place, and time.  Skin: Skin is warm.  Psychiatric: She has a normal mood and affect. Her behavior is normal. Judgment and thought content normal.     Assessment/Plan As above  Plan explore and repair as needed Schuyler Amor 12/13/2013, 7:16 AM

## 2013-12-13 NOTE — Anesthesia Preprocedure Evaluation (Signed)
Anesthesia Evaluation  Patient identified by MRN, date of birth, ID band Patient awake    Reviewed: Allergy & Precautions, H&P , NPO status , Patient's Chart, lab work & pertinent test results  Airway Mallampati: I TM Distance: >3 FB Neck ROM: full    Dental  (+) Teeth Intact, Dental Advidsory Given   Pulmonary neg pulmonary ROS,  breath sounds clear to auscultation        Cardiovascular + Peripheral Vascular Disease Rhythm:regular Rate:Normal  Raynaud's disease   Neuro/Psych  Headaches, negative psych ROS   GI/Hepatic negative GI ROS, Neg liver ROS,   Endo/Other  negative endocrine ROS  Renal/GU negative Renal ROS     Musculoskeletal   Abdominal   Peds  Hematology   Anesthesia Other Findings Pt Breastfeeding, would like a list of medicines given during surgery.  Advised not to breast feed for 24 hours after surgery.  Reproductive/Obstetrics negative OB ROS                           Anesthesia Physical Anesthesia Plan  ASA: II  Anesthesia Plan: General LMA   Post-op Pain Management:    Induction:   Airway Management Planned:   Additional Equipment:   Intra-op Plan:   Post-operative Plan:   Informed Consent: I have reviewed the patients History and Physical, chart, labs and discussed the procedure including the risks, benefits and alternatives for the proposed anesthesia with the patient or authorized representative who has indicated his/her understanding and acceptance.   Dental Advisory Given  Plan Discussed with: Anesthesiologist, CRNA and Surgeon  Anesthesia Plan Comments:         Anesthesia Quick Evaluation

## 2013-12-13 NOTE — Anesthesia Procedure Notes (Signed)
Procedure Name: LMA Insertion Date/Time: 12/13/2013 8:54 AM Performed by: Williemae Area B Pre-anesthesia Checklist: Patient identified, Emergency Drugs available, Suction available and Patient being monitored Patient Re-evaluated:Patient Re-evaluated prior to inductionOxygen Delivery Method: Circle system utilized Preoxygenation: Pre-oxygenation with 100% oxygen Intubation Type: IV induction Ventilation: Mask ventilation without difficulty LMA: LMA inserted LMA Size: 3.0 Number of attempts: 2 (Size 4 LMA would not go into mouth) Placement Confirmation: breath sounds checked- equal and bilateral and positive ETCO2 Tube secured with: taped across cheeks; gauze roll b/t teeth. Dental Injury: Teeth and Oropharynx as per pre-operative assessment

## 2013-12-13 NOTE — Anesthesia Postprocedure Evaluation (Signed)
Anesthesia Post Note  Patient: Lauren Burgess  Procedure(s) Performed: Procedure(s) (LRB): EXPLORATION AND REPAIR OF COMMON DIGITAL ARTERY AND NERVE. NEURLYSIS OF COMMON DIGITAL NERVE. (Left)  Anesthesia type: general  Patient location: PACU  Post pain: Pain level controlled  Post assessment: Patient's Cardiovascular Status Stable  Last Vitals:  Filed Vitals:   12/13/13 1045  BP: 106/72  Pulse: 54  Temp:   Resp: 14    Post vital signs: Reviewed and stable  Level of consciousness: sedated  Complications: No apparent anesthesia complications

## 2013-12-13 NOTE — Op Note (Signed)
See note [phone]

## 2013-12-13 NOTE — Transfer of Care (Signed)
Immediate Anesthesia Transfer of Care Note  Patient: Lauren Burgess  Procedure(s) Performed: Procedure(s): EXPLORATION AND REPAIR OF COMMON DIGITAL ARTERY AND NERVE. NEURLYSIS OF COMMON DIGITAL NERVE. (Left)  Patient Location: PACU  Anesthesia Type:General  Level of Consciousness: awake, alert  and patient cooperative  Airway & Oxygen Therapy: Patient Spontanous Breathing and Patient connected to nasal cannula oxygen  Post-op Assessment: Report given to PACU RN, Post -op Vital signs reviewed and stable and Patient moving all extremities  Post vital signs: Reviewed and stable  Complications: No apparent anesthesia complications

## 2013-12-14 NOTE — Op Note (Signed)
NAME:  Lauren Burgess, Lauren Burgess            ACCOUNT NO.:  [id number]  MEDICAL RECORD NO.:  [id number]  LOCATION:  [facility]                         FACILITY:  [facility]  PHYSICIAN:  Sheral Apley. Shemiah Rosch, M.D.DATE OF BIRTH:  09/30/1985  DATE OF PROCEDURE:  12/13/2013 DATE OF DISCHARGE:  12/13/2013                              OPERATIVE REPORT   PREOPERATIVE DIAGNOSIS:  Deep laceration, palmar aspect, left hand.  POSTOPERATIVE DIAGNOSIS:  Deep laceration, palmar aspect, left hand.  PROCEDURE:  Aspiration of above with primary repair of common digital artery to long and ring finger and neurolysis of common digital nerve to long and ring finger, microscopically.  SURGEON:  Sheral Apley. Burney Gauze, M.D.  ASSISTANT:  None.  ANESTHESIA:  General.  COMPLICATION:  None.  DRAINS:  None.  DESCRIPTION OF PROCEDURE:  The patient was taken to the operating suite. After induction of general anesthesia, left upper extremity was prepped and draped in sterile fashion.  An Esmarch was used to exsanguinate the limb.  Tourniquet was inflated to 250 mmHg.  At this point in time, a laceration in the webspace between the long and ring fingers was done proximally and distally in gentle Z-fashion, distal radial and proximal ulnar.  Dissection was carried down to the long finger and ring finger in the metacarpal area.  There was a 9%  laceration of the common digital artery to the long and ring web as well as significant scarring of the common digital nerve at bifurcation to the long and ring fingers. Under macroscopic magnification, we dissected the area out and then brought the microscope onto the field.  Under microscopic magnification, we did complete neurolysis of the common digital nerve of the long and ring out to the branches terminally then under microscopic magnification, we debrided the artery ends of adventitia, used the lacrimal duct dilators to dilate the vessels both proximally and distally until flow was  evident.  We then irrigated and repaired using three 9-0 nylon sutures in a triangular type pattern.  The wound was then irrigated and loosely closed with 4-0 Vicryl Rapide.  Xeroform, 4x4s, and a dorsal extension block splint was applied.  The patient tolerated the procedure well in concealed fashion.     Sheral Apley Burney Gauze, M.D.     MAW/MEDQ  D:  12/13/2013  T:  12/14/2013  Job:  [id number]

## 2013-12-16 ENCOUNTER — Encounter (HOSPITAL_COMMUNITY): Payer: Self-pay | Admitting: Orthopedic Surgery

## 2014-05-19 ENCOUNTER — Encounter (HOSPITAL_COMMUNITY): Payer: Self-pay | Admitting: Orthopedic Surgery

## 2015-06-25 LAB — OB RESULTS CONSOLE HIV ANTIBODY (ROUTINE TESTING): HIV: NONREACTIVE

## 2015-06-25 LAB — OB RESULTS CONSOLE ANTIBODY SCREEN: ANTIBODY SCREEN: NEGATIVE

## 2015-06-25 LAB — OB RESULTS CONSOLE ABO/RH: RH TYPE: POSITIVE

## 2015-06-25 LAB — OB RESULTS CONSOLE GC/CHLAMYDIA
CHLAMYDIA, DNA PROBE: NEGATIVE
GC PROBE AMP, GENITAL: NEGATIVE

## 2015-06-25 LAB — OB RESULTS CONSOLE HEPATITIS B SURFACE ANTIGEN: Hepatitis B Surface Ag: NEGATIVE

## 2015-06-25 LAB — OB RESULTS CONSOLE RUBELLA ANTIBODY, IGM: Rubella: IMMUNE

## 2015-06-25 LAB — OB RESULTS CONSOLE RPR: RPR: NONREACTIVE

## 2015-07-19 NOTE — L&D Delivery Note (Signed)
Delivery Note At 12:23 PM a viable and healthy female was delivered via Vaginal, Spontaneous Delivery (Presentation: Right Occiput Anterior).  APGAR: 9, 9; weight pending .   Placenta status: Intact, Spontaneous.  Cord: 3 vessels with the following complications: None.  Cord pH: na  Anesthesia: Epidural  Episiotomy: None Lacerations: 2nd degree;Perineal Suture Repair: 2.0 vicryl rapide Est. Blood Loss (mL):  100  Mom to postpartum.  Baby to Couplet care / Skin to Skin.  Natosha Bou J 02/03/2016, 1:01 PM

## 2015-07-24 DIAGNOSIS — Z3490 Encounter for supervision of normal pregnancy, unspecified, unspecified trimester: Secondary | ICD-10-CM | POA: Diagnosis not present

## 2015-07-24 DIAGNOSIS — Z3481 Encounter for supervision of other normal pregnancy, first trimester: Secondary | ICD-10-CM | POA: Diagnosis not present

## 2015-08-17 DIAGNOSIS — R3911 Hesitancy of micturition: Secondary | ICD-10-CM | POA: Diagnosis not present

## 2015-08-21 DIAGNOSIS — Z36 Encounter for antenatal screening of mother: Secondary | ICD-10-CM | POA: Diagnosis not present

## 2015-08-21 DIAGNOSIS — Z3482 Encounter for supervision of other normal pregnancy, second trimester: Secondary | ICD-10-CM | POA: Diagnosis not present

## 2015-09-03 DIAGNOSIS — Z3482 Encounter for supervision of other normal pregnancy, second trimester: Secondary | ICD-10-CM | POA: Diagnosis not present

## 2015-09-03 DIAGNOSIS — Z36 Encounter for antenatal screening of mother: Secondary | ICD-10-CM | POA: Diagnosis not present

## 2015-11-05 DIAGNOSIS — Z23 Encounter for immunization: Secondary | ICD-10-CM | POA: Diagnosis not present

## 2015-11-05 DIAGNOSIS — Z36 Encounter for antenatal screening of mother: Secondary | ICD-10-CM | POA: Diagnosis not present

## 2015-11-19 DIAGNOSIS — Z3483 Encounter for supervision of other normal pregnancy, third trimester: Secondary | ICD-10-CM | POA: Diagnosis not present

## 2015-11-19 DIAGNOSIS — Z36 Encounter for antenatal screening of mother: Secondary | ICD-10-CM | POA: Diagnosis not present

## 2015-12-30 DIAGNOSIS — Z3483 Encounter for supervision of other normal pregnancy, third trimester: Secondary | ICD-10-CM | POA: Diagnosis not present

## 2015-12-30 LAB — OB RESULTS CONSOLE GBS: STREP GROUP B AG: POSITIVE

## 2016-01-15 DIAGNOSIS — Z3483 Encounter for supervision of other normal pregnancy, third trimester: Secondary | ICD-10-CM | POA: Diagnosis not present

## 2016-01-15 DIAGNOSIS — Z36 Encounter for antenatal screening of mother: Secondary | ICD-10-CM | POA: Diagnosis not present

## 2016-02-01 ENCOUNTER — Encounter (HOSPITAL_COMMUNITY): Payer: Self-pay | Admitting: *Deleted

## 2016-02-01 ENCOUNTER — Other Ambulatory Visit: Payer: Self-pay | Admitting: Obstetrics & Gynecology

## 2016-02-01 ENCOUNTER — Telehealth (HOSPITAL_COMMUNITY): Payer: Self-pay | Admitting: *Deleted

## 2016-02-01 NOTE — Telephone Encounter (Signed)
Preadmission screen  

## 2016-02-03 ENCOUNTER — Inpatient Hospital Stay (HOSPITAL_COMMUNITY)
Admission: AD | Admit: 2016-02-03 | Discharge: 2016-02-05 | DRG: 775 | Disposition: A | Discharge status: Home / Self Care | Payer: 59 | Source: Ambulatory Visit | Attending: Obstetrics | Admitting: Obstetrics

## 2016-02-03 ENCOUNTER — Inpatient Hospital Stay (HOSPITAL_COMMUNITY): Payer: 59 | Admitting: Anesthesiology

## 2016-02-03 ENCOUNTER — Encounter (HOSPITAL_COMMUNITY): Payer: Self-pay | Admitting: *Deleted

## 2016-02-03 DIAGNOSIS — I7300 Raynaud's syndrome without gangrene: Secondary | ICD-10-CM | POA: Diagnosis present

## 2016-02-03 DIAGNOSIS — Z3483 Encounter for supervision of other normal pregnancy, third trimester: Secondary | ICD-10-CM | POA: Diagnosis present

## 2016-02-03 DIAGNOSIS — O99824 Streptococcus B carrier state complicating childbirth: Secondary | ICD-10-CM | POA: Diagnosis present

## 2016-02-03 DIAGNOSIS — Z8249 Family history of ischemic heart disease and other diseases of the circulatory system: Secondary | ICD-10-CM | POA: Diagnosis not present

## 2016-02-03 DIAGNOSIS — Z3A39 39 weeks gestation of pregnancy: Secondary | ICD-10-CM

## 2016-02-03 DIAGNOSIS — N898 Other specified noninflammatory disorders of vagina: Secondary | ICD-10-CM | POA: Diagnosis not present

## 2016-02-03 DIAGNOSIS — Z3490 Encounter for supervision of normal pregnancy, unspecified, unspecified trimester: Secondary | ICD-10-CM

## 2016-02-03 LAB — CBC
HCT: 38.4 % (ref 36.0–46.0)
Hemoglobin: 13.6 g/dL (ref 12.0–15.0)
MCH: 32.2 pg (ref 26.0–34.0)
MCHC: 35.4 g/dL (ref 30.0–36.0)
MCV: 91 fL (ref 78.0–100.0)
Platelets: 161 K/uL (ref 150–400)
RBC: 4.22 MIL/uL (ref 3.87–5.11)
RDW: 13.5 % (ref 11.5–15.5)
WBC: 10.1 K/uL (ref 4.0–10.5)

## 2016-02-03 LAB — TYPE AND SCREEN
ABO/RH(D): O POS
Antibody Screen: NEGATIVE

## 2016-02-03 MED ORDER — EPHEDRINE 5 MG/ML INJ
10.0000 mg | INTRAVENOUS | Status: DC | PRN
  Filled 2016-02-03: qty 2

## 2016-02-03 MED ORDER — PENICILLIN G POTASSIUM 5000000 UNITS IJ SOLR: 2.5000 10*6.[IU] | INTRAVENOUS | Status: DC

## 2016-02-03 MED ORDER — LACTATED RINGERS IV SOLN: 500.0000 mL | INTRAVENOUS | Status: DC | PRN

## 2016-02-03 MED ORDER — ONDANSETRON HCL 4 MG/2ML IJ SOLN: 4.0000 mg | Freq: Four times a day (QID) | INTRAMUSCULAR | Status: DC | PRN

## 2016-02-03 MED ORDER — ONDANSETRON HCL 4 MG PO TABS: 4.0000 mg | ORAL_TABLET | ORAL | Status: DC | PRN

## 2016-02-03 MED ORDER — METHYLERGONOVINE MALEATE 0.2 MG PO TABS: 0.2000 mg | ORAL_TABLET | ORAL | Status: DC | PRN

## 2016-02-03 MED ORDER — PHENYLEPHRINE 40 MCG/ML (10ML) SYRINGE FOR IV PUSH (FOR BLOOD PRESSURE SUPPORT)
80.0000 ug | PREFILLED_SYRINGE | INTRAVENOUS | Status: DC | PRN
  Filled 2016-02-03: qty 5

## 2016-02-03 MED ORDER — FENTANYL 2.5 MCG/ML BUPIVACAINE 1/10 % EPIDURAL INFUSION (WH - ANES)
INTRAMUSCULAR | Status: AC
  Filled 2016-02-03: qty 125

## 2016-02-03 MED ORDER — OXYCODONE-ACETAMINOPHEN 5-325 MG PO TABS: 1.0000 | ORAL_TABLET | ORAL | Status: DC | PRN

## 2016-02-03 MED ORDER — COCONUT OIL OIL: 1.0000 "application " | TOPICAL_OIL | Status: DC | PRN

## 2016-02-03 MED ORDER — METHYLERGONOVINE MALEATE 0.2 MG/ML IJ SOLN: 0.2000 mg | INTRAMUSCULAR | Status: DC | PRN

## 2016-02-03 MED ORDER — ACETAMINOPHEN 325 MG PO TABS: 650.0000 mg | ORAL_TABLET | ORAL | Status: DC | PRN

## 2016-02-03 MED ORDER — SENNOSIDES-DOCUSATE SODIUM 8.6-50 MG PO TABS
2.0000 | ORAL_TABLET | ORAL | Status: DC
  Administered 2016-02-05: 2 via ORAL
  Filled 2016-02-03: qty 2

## 2016-02-03 MED ORDER — SOD CITRATE-CITRIC ACID 500-334 MG/5ML PO SOLN: 30.0000 mL | ORAL | Status: DC | PRN

## 2016-02-03 MED ORDER — DIPHENHYDRAMINE HCL 25 MG PO CAPS: 25.0000 mg | ORAL_CAPSULE | Freq: Four times a day (QID) | ORAL | Status: DC | PRN

## 2016-02-03 MED ORDER — PHENYLEPHRINE 40 MCG/ML (10ML) SYRINGE FOR IV PUSH (FOR BLOOD PRESSURE SUPPORT)
PREFILLED_SYRINGE | INTRAVENOUS | Status: AC
  Filled 2016-02-03: qty 20

## 2016-02-03 MED ORDER — ONDANSETRON HCL 4 MG/2ML IJ SOLN: 4.0000 mg | INTRAMUSCULAR | Status: DC | PRN

## 2016-02-03 MED ORDER — SODIUM CHLORIDE 0.9 % IV SOLN
2.0000 g | Freq: Four times a day (QID) | INTRAVENOUS | Status: DC
  Administered 2016-02-03: 2 g via INTRAVENOUS
  Filled 2016-02-03 (×3): qty 2000

## 2016-02-03 MED ORDER — DIBUCAINE 1 % RE OINT: 1.0000 "application " | TOPICAL_OINTMENT | RECTAL | Status: DC | PRN

## 2016-02-03 MED ORDER — ZOLPIDEM TARTRATE 5 MG PO TABS: 5.0000 mg | ORAL_TABLET | Freq: Every evening | ORAL | Status: DC | PRN

## 2016-02-03 MED ORDER — FENTANYL 2.5 MCG/ML BUPIVACAINE 1/10 % EPIDURAL INFUSION (WH - ANES)
14.0000 mL/h | INTRAMUSCULAR | Status: DC | PRN
  Administered 2016-02-03 (×2): 14 mL/h via EPIDURAL

## 2016-02-03 MED ORDER — SIMETHICONE 80 MG PO CHEW: 80.0000 mg | CHEWABLE_TABLET | ORAL | Status: DC | PRN

## 2016-02-03 MED ORDER — LACTATED RINGERS IV SOLN
500.0000 mL | Freq: Once | INTRAVENOUS | Status: AC
  Administered 2016-02-03: 11:00:00 via INTRAVENOUS

## 2016-02-03 MED ORDER — LIDOCAINE HCL (PF) 1 % IJ SOLN
INTRAMUSCULAR | Status: DC | PRN
  Administered 2016-02-03 (×2): 5 mL via EPIDURAL

## 2016-02-03 MED ORDER — BENZOCAINE-MENTHOL 20-0.5 % EX AERO
1.0000 "application " | INHALATION_SPRAY | CUTANEOUS | Status: DC | PRN
  Administered 2016-02-03: 1 via TOPICAL
  Filled 2016-02-03: qty 56

## 2016-02-03 MED ORDER — TETANUS-DIPHTH-ACELL PERTUSSIS 5-2.5-18.5 LF-MCG/0.5 IM SUSP: 0.5000 mL | Freq: Once | INTRAMUSCULAR | Status: DC

## 2016-02-03 MED ORDER — PRENATAL MULTIVITAMIN CH
1.0000 | ORAL_TABLET | Freq: Every day | ORAL | Status: DC
  Administered 2016-02-04 – 2016-02-05 (×2): 1 via ORAL
  Filled 2016-02-03 (×2): qty 1

## 2016-02-03 MED ORDER — LIDOCAINE HCL (PF) 1 % IJ SOLN
30.0000 mL | INTRAMUSCULAR | Status: DC | PRN
  Filled 2016-02-03: qty 30

## 2016-02-03 MED ORDER — DIPHENHYDRAMINE HCL 50 MG/ML IJ SOLN: 12.5000 mg | INTRAMUSCULAR | Status: DC | PRN

## 2016-02-03 MED ORDER — LACTATED RINGERS IV SOLN
INTRAVENOUS | Status: DC
  Administered 2016-02-03: 11:00:00 via INTRAVENOUS

## 2016-02-03 MED ORDER — WITCH HAZEL-GLYCERIN EX PADS: 1.0000 "application " | MEDICATED_PAD | CUTANEOUS | Status: DC | PRN

## 2016-02-03 MED ORDER — OXYTOCIN BOLUS FROM INFUSION
500.0000 mL | INTRAVENOUS | Status: DC
  Administered 2016-02-03: 500 mL via INTRAVENOUS

## 2016-02-03 MED ORDER — OXYCODONE-ACETAMINOPHEN 5-325 MG PO TABS: 2.0000 | ORAL_TABLET | ORAL | Status: DC | PRN

## 2016-02-03 MED ORDER — OXYTOCIN 40 UNITS IN LACTATED RINGERS INFUSION - SIMPLE MED
2.5000 [IU]/h | INTRAVENOUS | Status: DC
  Administered 2016-02-03: 2.5 [IU]/h via INTRAVENOUS
  Filled 2016-02-03: qty 1000

## 2016-02-03 MED ORDER — IBUPROFEN 600 MG PO TABS
600.0000 mg | ORAL_TABLET | Freq: Four times a day (QID) | ORAL | Status: DC
  Administered 2016-02-03 – 2016-02-05 (×8): 600 mg via ORAL
  Filled 2016-02-03 (×8): qty 1

## 2016-02-03 MED ORDER — DOCUSATE SODIUM 100 MG PO CAPS
100.0000 mg | ORAL_CAPSULE | Freq: Two times a day (BID) | ORAL | Status: DC
  Administered 2016-02-03 – 2016-02-05 (×4): 100 mg via ORAL
  Filled 2016-02-03 (×4): qty 1

## 2016-02-03 MED ORDER — PENICILLIN G POTASSIUM 5000000 UNITS IJ SOLR: 5.0000 10*6.[IU] | Freq: Once | INTRAVENOUS | Status: DC

## 2016-02-03 MED ORDER — EPHEDRINE 5 MG/ML INJ
INTRAVENOUS | Status: AC
  Filled 2016-02-03: qty 4

## 2016-02-03 NOTE — H&P (Signed)
OB ADMISSION/ HISTORY & PHYSICAL:  Admission Date: 02/03/2016 10:47 AM  Admit Diagnosis: Term pregnancy in active labor, GBS culture positive  Lauren Burgess is a 30 y.o. female presenting for painful ctx.  Prenatal History: G2P1001   EDC : 02/04/2016, by Other Basis  Prenatal care at [facility] since [redacted] weeks gestation  Prenatal course complicated by GBS pos cx,  small drop in platelets, normal on admit  [redacted]w[redacted]d - EFW 77%, AFI 16, vertex  Prenatal Labs: ABO, Rh: O (12/08 0000)  Antibody: Negative (12/08 0000) Rubella: Immune (12/08 0000)  RPR: Nonreactive (12/08 0000)  HBsAg: Negative (12/08 0000)  HIV: Non-reactive (12/08 0000)  GBS: Positive (06/14 0000)  1 hr Glucola : wnl   Medical / Surgical History :  Past medical history:  Past Medical History  Diagnosis Date  . Medical history non-contributory   . Raynaud disease   . [id number])      Past surgical history:  Past Surgical History  Procedure Laterality Date  . No past surgeries    . Tonsillectomy    . Tonsillectomy and adenoidectomy    . Wound exploration Left 12/13/2013    Procedure: EXPLORATION AND REPAIR OF COMMON DIGITAL ARTERY AND NERVE. NEURLYSIS OF COMMON DIGITAL NERVE.;  Surgeon: Schuyler Amor, MD;  Location: [facility];  Service: Orthopedics;  Laterality: Left;     Family History:  Family History  Problem Relation Age of Onset  . Cancer Maternal Grandmother     breast  . Cancer Maternal Grandfather     prostate, pancreatic  . Heart attack Maternal Grandfather   . Hypertension Maternal Grandfather      Social History:  reports that she has never smoked. She has never used smokeless tobacco. She reports that she drinks alcohol. She reports that she does not use illicit drugs.   Allergies: Review of patient's allergies indicates no known allergies.    Current Medications at time of admission:  Prescriptions prior to admission  Medication Sig Dispense Refill Last  Dose  . Prenatal Vit-Fe Fumarate-FA (PRENATAL MULTIVITAMIN) TABS tablet Take 1 tablet by mouth at bedtime.   02/03/2016 at Unknown time      Review of Systems: Review of Systems  Eyes: Negative for blurred vision.  Gastrointestinal: Positive for abdominal pain. Negative for nausea.  Neurological: Negative for headaches.  All other systems reviewed and are negative.        Physical Exam:  General: AAO x 3, NAD Heart:RRR Lungs:CTAB Abdomen:gravid, NT Extremities:no edema Genitalia / VE: 5/100/BBOW per RN FHR:130. mdo var, + accels, no decels TOCO:q2-3 min, mod/strong to palp  Labs:     Recent Labs  02/03/16 1115  WBC 10.1  HGB 13.6  HCT 38.4  PLT 161     Assessment:  30 y.o. G2P1001 at [redacted]w[redacted]d  1. Labor: active, rapid progress 2. Fetal Wellbeing: Category 1  3. Pain Control: epidural in progress 4. GBS: positive   Plan:  1. Admit to BS 2. Routine L&D orders 3. Analgesia/anesthesia PRN  4. Ampicillin protocol for rapid progress in labor 5. Anticipate NSVB   Dr. Ronita Hipps attending, CNM on standby for MD while in surgery    [name], CNM 02/03/2016, 11:50 AM

## 2016-02-03 NOTE — Lactation Note (Signed)
This note was copied from a baby's chart. Lactation Consultation Note  Patient Name: Lauren Burgess Today's Date: 02/03/2016 Reason for consult: Initial assessment Baby at 6 hr of life and mom reports baby is latching well. She does have some "very mild nipple soreness". Her L nipple cracked with her older child and she is worried it will happen again. She bf her son for 25yr. Showed mom how to lean back and bring baby to her. Discussed baby behavior, feeding frequency, baby belly size, voids, wt loss, breast changes, and nipple care. Demonstrated manual expression, colostrum noted bilaterally, spoon in room. Given lactation handouts. Aware of OP services and support group. She will call as needed.      Maternal Data Formula Feeding for Exclusion: No Has patient been taught Hand Expression?: Yes Does the patient have breastfeeding experience prior to this delivery?: Yes  Feeding Feeding Type: Breast Fed Length of feed: 15 min  LATCH Score/Interventions Latch: Grasps breast easily, tongue down, lips flanged, rhythmical sucking.  Audible Swallowing: A few with stimulation Intervention(s): Skin to skin;Hand expression  Type of Nipple: Everted at rest and after stimulation  Comfort (Breast/Nipple): Soft / non-tender     Hold (Positioning): Assistance needed to correctly position infant at breast and maintain latch. Intervention(s): Support Pillows;Position options  LATCH Score: 8  Lactation Tools Discussed/Used WIC Program: No   Consult Status Consult Status: Follow-up Date: 02/04/16 Follow-up type: In-patient    MARLA DELGIUDICE 02/03/2016, 7:15 PM

## 2016-02-03 NOTE — Anesthesia Preprocedure Evaluation (Signed)
Anesthesia Evaluation  Patient identified by MRN, date of birth, ID band Patient awake    Reviewed: Allergy & Precautions, H&P , NPO status , Patient's Chart, lab work & pertinent test results  Airway Mallampati: I  TM Distance: >3 FB Neck ROM: full    Dental no notable dental hx.    Pulmonary neg pulmonary ROS,    Pulmonary exam normal        Cardiovascular negative cardio ROS Normal cardiovascular exam     Neuro/Psych negative psych ROS   GI/Hepatic negative GI ROS, Neg liver ROS,   Endo/Other  negative endocrine ROS  Renal/GU negative Renal ROS     Musculoskeletal   Abdominal Normal abdominal exam  (+)   Peds  Hematology negative hematology ROS (+)   Anesthesia Other Findings   Reproductive/Obstetrics (+) Pregnancy                             Anesthesia Physical Anesthesia Plan  ASA: II  Anesthesia Plan: Epidural   Post-op Pain Management:    Induction:   Airway Management Planned:   Additional Equipment:   Intra-op Plan:   Post-operative Plan:   Informed Consent: I have reviewed the patients History and Physical, chart, labs and discussed the procedure including the risks, benefits and alternatives for the proposed anesthesia with the patient or authorized representative who has indicated his/her understanding and acceptance.     Plan Discussed with:   Anesthesia Plan Comments:         Anesthesia Quick Evaluation

## 2016-02-03 NOTE — Anesthesia Procedure Notes (Signed)
Epidural Patient location during procedure: OB Start time: 02/03/2016 11:49 AM End time: 02/03/2016 11:53 AM  Staffing Anesthesiologist: Lyn Hollingshead Performed by: anesthesiologist   Preanesthetic Checklist Completed: patient identified, surgical consent, pre-op evaluation, timeout performed, IV checked, risks and benefits discussed and monitors and equipment checked  Epidural Patient position: sitting Prep: site prepped and draped and DuraPrep Patient monitoring: continuous pulse ox and blood pressure Approach: midline Location: L3-L4 Injection technique: LOR air  Needle:  Needle type: Tuohy  Needle gauge: 17 G Needle length: 9 cm and 9 Needle insertion depth: 4 cm Catheter type: closed end flexible Catheter size: 19 Gauge Catheter at skin depth: 9 cm Test dose: negative and Other  Assessment Sensory level: T9 Events: blood not aspirated, injection not painful, no injection resistance, negative IV test and no paresthesia  Additional Notes Reason for block:procedure for pain

## 2016-02-03 NOTE — Progress Notes (Signed)
Pt requesting colace -Dr Pamala Hurry called

## 2016-02-04 ENCOUNTER — Inpatient Hospital Stay (HOSPITAL_COMMUNITY): Admission: RE | Admit: 2016-02-04 | Payer: 59 | Source: Ambulatory Visit

## 2016-02-04 LAB — CBC
HCT: 35.7 % — ABNORMAL LOW (ref 36.0–46.0)
HEMOGLOBIN: 12.2 g/dL (ref 12.0–15.0)
MCH: 31 pg (ref 26.0–34.0)
MCHC: 34.2 g/dL (ref 30.0–36.0)
MCV: 90.8 fL (ref 78.0–100.0)
PLATELETS: 140 10*3/uL — AB (ref 150–400)
RBC: 3.93 MIL/uL (ref 3.87–5.11)
RDW: 13.5 % (ref 11.5–15.5)
WBC: 10.9 10*3/uL — ABNORMAL HIGH (ref 4.0–10.5)

## 2016-02-04 LAB — RPR: RPR Ser Ql: NONREACTIVE

## 2016-02-04 NOTE — Progress Notes (Signed)
Patient ID: BERENIS CENAC, female   DOB: 25-Jul-1985, 30 y.o.   MRN: [id number] PPD # 1  Subjective: Pt reports feeling well, tired / Pain controlled with ibuprofen Tolerating po/ Voiding without problems/ No n/v Bleeding is light Newborn info:  Information for the patient's newborn:  Xophia, Angeli [id number]  female Feeding: breast   Objective:  VS: Blood pressure 92/56, pulse 58, temperature 98.3 F (36.8 C), temperature source Oral, resp. rate 18, height 5\' 3"  (1.6 m), weight 59.875 kg (132 lb), SpO2 99 %, unknown if currently breastfeeding.    Recent Labs  02/03/16 1115 02/04/16 0544  WBC 10.1 10.9*  HGB 13.6 12.2  HCT 38.4 35.7*  PLT 161 140*    Blood type: --/--/O POS (07/19 1115) Rubella: Immune (12/08 0000)    Physical Exam:  General: alert, cooperative and no distress CV: Regular rate and rhythm Resp: clear Abdomen: soft, nontender, normal bowel sounds Uterine Fundus: firm, below umbilicus, nontender Perineum: not examined; up and dressed Lochia: minimal Ext: edema trace and Homans sign is negative, no sign of DVT   A/P: PPD # 1/ G2P2002/ S/P: SVD w/2nd deg lac Doing well Continue routine post partum orders Anticipate D/C home in AM    Darleen Crocker, MSN, [facility] 02/04/2016, 10:05 AM

## 2016-02-04 NOTE — Anesthesia Postprocedure Evaluation (Signed)
Anesthesia Post Note  Patient: Lauren Burgess  Procedure(s) Performed: * No procedures listed *  Patient location during evaluation: Mother Baby Anesthesia Type: Epidural Level of consciousness: awake Pain management: pain level controlled Vital Signs Assessment: post-procedure vital signs reviewed and stable Respiratory status: spontaneous breathing Cardiovascular status: stable Postop Assessment: no headache, no backache, epidural receding and patient able to bend at knees Anesthetic complications: no     Last Vitals:  Filed Vitals:   02/03/16 1625 02/03/16 2130  BP: 106/62 110/62  Pulse: 70 49  Temp: 36.9 C 36.4 C    Last Pain:  Filed Vitals:   02/04/16 0241  PainSc: 0-No pain   Pain Goal:                 Everette Rank

## 2016-02-05 MED ORDER — ACETAMINOPHEN 325 MG PO TABS: 650.0000 mg | ORAL_TABLET | ORAL | Status: DC | PRN

## 2016-02-05 MED ORDER — SENNOSIDES-DOCUSATE SODIUM 8.6-50 MG PO TABS: 2.0000 | ORAL_TABLET | ORAL | Status: DC

## 2016-02-05 MED ORDER — IBUPROFEN 600 MG PO TABS: 600.0000 mg | ORAL_TABLET | Freq: Four times a day (QID) | ORAL | Status: DC

## 2016-02-05 MED ORDER — BENZOCAINE-MENTHOL 20-0.5 % EX AERO: 1.0000 "application " | INHALATION_SPRAY | CUTANEOUS | Status: DC | PRN

## 2016-02-05 MED ORDER — COCONUT OIL OIL: 1.0000 "application " | TOPICAL_OIL | Status: DC | PRN

## 2016-02-05 NOTE — Lactation Note (Signed)
This note was copied from a baby's chart. Lactation Consultation Note  Patient Name: Lauren Burgess [id number] Date: 02/05/2016 Reason for consult: Follow-up assessment   With this experienced breast feeding mom, who states breastfeeding going well,  And denies any nipple pain,  Breast care reviewed, and mom knows to call for questions/concerns to lactation, after discharge to home.   Maternal Data    Feeding Feeding Type: Breast Fed Length of feed: 10 min  LATCH Score/Interventions Latch: Grasps breast easily, tongue down, lips flanged, rhythmical sucking.  Audible Swallowing: Spontaneous and intermittent  Type of Nipple: Everted at rest and after stimulation  Comfort (Breast/Nipple): Filling, red/small blisters or bruises, mild/mod discomfort  Problem noted: Mild/Moderate discomfort Interventions (Mild/moderate discomfort): Hand expression  Hold (Positioning): No assistance needed to correctly position infant at breast. Intervention(s): Skin to skin;Support Pillows;Position options  LATCH Score: 9  Lactation Tools Discussed/Used     Consult Status Consult Status: Complete Follow-up type: Call as needed    Tonna Corner 02/05/2016, 12:07 PM

## 2016-02-05 NOTE — Progress Notes (Signed)
Post Partum Day #2            Information for the patient's newborn:  Hadlei, Ramaley [id number]  female   Feeding: breast  Subjective: No HA, SOB, CP, F/C, breast symptoms. Pain small, takes ibuprofen. Normal vaginal bleeding, no clots.      Objective:  Temp:  [98.5 F (36.9 C)] 98.5 F (36.9 C) (07/20 1752) Pulse Rate:  [51-67] 51 (07/21 0614) Resp:  [18-20] 18 (07/21 0614) BP: (110-115)/(57-76) 110/57 mmHg (07/21 0614)  No intake or output data in the 24 hours ending 02/05/16 0837     Recent Labs  02/03/16 1115 02/04/16 0544  WBC 10.1 10.9*  HGB 13.6 12.2  HCT 38.4 35.7*  PLT 161 140*    Blood type: --/--/O POS (07/19 1115) Rubella: Immune (12/08 0000)    Physical Exam:  General: alert, cooperative and no distress Uterine Fundus: firm Lochia: appropriate Perineum: repair intact, edema none DVT Evaluation: No cords or calf tenderness. No significant calf/ankle edema.    Assessment/Plan: PPD # 2 / 30 y.o., [id number] S/P:spontaneous vaginal, 2nd deg perineal lac   Principal Problem:   Postpartum care following vaginal delivery (7/19)    normal postpartum exam  Continue current postpartum care  D/C home   LOS: 2 days   [name], CNM, MSN 02/05/2016, 8:37 AM

## 2016-02-05 NOTE — Discharge Summary (Signed)
Obstetric Discharge Summary Reason for Admission: onset of labor Prenatal Procedures: ultrasound Intrapartum Procedures: spontaneous vaginal delivery, GBS prophylaxis and epidural Postpartum Procedures: none Complications-Operative and Postpartum: 2nd degree perineal laceration HEMOGLOBIN  Date Value Ref Range Status  02/04/2016 12.2 12.0 - 15.0 g/dL Final   HCT  Date Value Ref Range Status  02/04/2016 35.7* 36.0 - 46.0 % Final    Physical Exam:  General: alert, cooperative and no distress Lochia: appropriate Uterine Fundus: firm Incision: healing well DVT Evaluation: No cords or calf tenderness. No significant calf/ankle edema.  Discharge Diagnoses: Term Pregnancy-delivered  Discharge Information: Date: 02/05/2016 Activity: pelvic rest Diet: routine Medications: PNV, Colace and ibuprofen Condition: stable Instructions: refer to practice specific booklet Discharge to: home Follow-up Information    Follow up with LAVOIE,MARIE-LYNE, MD. Schedule an appointment as soon as possible for a visit in 6 weeks.   Specialty:  Obstetrics and Gynecology   Why:  For Postpartum follow-up   Contact information:   [address] [place] [address] [phone]       Newborn Data: Live born female  Birth Weight: 7 lb 7.9 oz (3400 g) APGAR: 9, 9  Home with mother.  [name], CNM 02/05/2016, 8:39 AM

## 2016-09-01 ENCOUNTER — Ambulatory Visit (INDEPENDENT_AMBULATORY_CARE_PROVIDER_SITE_OTHER): Payer: 59 | Admitting: Family Medicine

## 2016-09-01 VITALS — BP 115/64 | HR 68 | Temp 98.9°F | Wt 110.0 lb

## 2016-09-01 DIAGNOSIS — M25561 Pain in right knee: Secondary | ICD-10-CM | POA: Diagnosis not present

## 2016-09-01 DIAGNOSIS — G8929 Other chronic pain: Secondary | ICD-10-CM

## 2016-09-01 DIAGNOSIS — R5383 Other fatigue: Secondary | ICD-10-CM | POA: Diagnosis not present

## 2016-09-01 LAB — IRON AND TIBC
%SAT: 19 % (ref 11–50)
Iron: 60 ug/dL (ref 40–190)
TIBC: 310 ug/dL (ref 250–450)
UIBC: 250 ug/dL (ref 125–400)

## 2016-09-01 LAB — COMPLETE METABOLIC PANEL WITH GFR
ALBUMIN: 4.1 g/dL (ref 3.6–5.1)
ALT: 14 U/L (ref 6–29)
AST: 20 U/L (ref 10–30)
Alkaline Phosphatase: 85 U/L (ref 33–115)
BILIRUBIN TOTAL: 0.3 mg/dL (ref 0.2–1.2)
BUN: 15 mg/dL (ref 7–25)
CO2: 24 mmol/L (ref 20–31)
CREATININE: 0.73 mg/dL (ref 0.50–1.10)
Calcium: 9.1 mg/dL (ref 8.6–10.2)
Chloride: 104 mmol/L (ref 98–110)
GFR, Est African American: >89 mL/min (ref >=60)
GFR, Est Non African American: >89 mL/min (ref >=60)
GLUCOSE: 82 mg/dL (ref 65–99)
Potassium: 4.4 mmol/L (ref 3.5–5.3)
SODIUM: 139 mmol/L (ref 135–146)
TOTAL PROTEIN: 7.1 g/dL (ref 6.1–8.1)

## 2016-09-01 LAB — TSH: TSH: 0.98 m[IU]/L

## 2016-09-01 LAB — CBC
HEMATOCRIT: 36.9 % (ref 35.0–45.0)
HEMOGLOBIN: 12.2 g/dL (ref 11.7–15.5)
MCH: 30 pg (ref 27.0–33.0)
MCHC: 33.1 g/dL (ref 32.0–36.0)
MCV: 90.7 fL (ref 80.0–100.0)
MPV: 11.2 fL (ref 7.5–12.5)
Platelets: 248 10*3/uL (ref 140–400)
RBC: 4.07 MIL/uL (ref 3.80–5.10)
RDW: 13.9 % (ref 11.0–15.0)
WBC: 6.3 10*3/uL (ref 3.8–10.8)

## 2016-09-01 LAB — FERRITIN: Ferritin: 37 ng/mL (ref 10–154)

## 2016-09-01 NOTE — Patient Instructions (Signed)
Thank you for coming in today. Get labs today.  Recheck in the near future as needed.  Work on more sleep.    Patellofemoral Pain Syndrome Introduction Patellofemoral pain syndrome is a condition that involves a softening or breakdown of the tissue (cartilage) on the underside of your kneecap (patella). This causes pain in the front of the knee. The condition is also called runner's knee or chondromalacia patella. Patellofemoral pain syndrome is most common in young adults who are active in sports. Your knee is the largest joint in your body. The patella covers the front of your knee and is attached to muscles above and below your knee. The underside of the patella is covered with a smooth type of cartilage (synovium). The smooth surface helps the patella glide easily when you move your knee. Patellofemoral pain syndrome causes swelling in the joint linings and bone surfaces in your knee. What are the causes? Patellofemoral pain syndrome can be caused by:  Overuse.  Poor alignment of your knee joints.  Weak leg muscles.  A direct blow to your kneecap. What increases the risk? You may be at risk for patellofemoral pain syndrome if you:  Do a lot of activities that can wear down your kneecap. These include:  Running.  Squatting.  Climbing stairs.  Start a new physical activity or exercise program.  Wear shoes that do not fit well.  Do not have good leg strength.  Are overweight. What are the signs or symptoms? Knee pain is the most common symptom of patellofemoral pain syndrome. This may feel like a dull, aching pain underneath your patella, in the front of your knee. There may be a popping or cracking sound when you move your knee. Pain may get worse with:  Exercise.  Climbing stairs.  Running.  Jumping.  Squatting.  Kneeling.  Sitting for a long time.  Moving or pushing on your patella. How is this diagnosed? Your health care provider may be able to diagnose  patellofemoral pain syndrome from your symptoms and medical history. You may be asked about your recent physical activities and which ones cause knee pain. Your health care provider may do a physical exam with certain tests to confirm the diagnosis. These may include:  Moving your patella back and forth.  Checking your range of knee motion.  Having you squat or jump to see if you have pain.  Checking the strength of your leg muscles. An MRI of the knee may also be done. How is this treated? Patellofemoral pain syndrome can usually be treated at home with rest, ice, compression, and elevation (RICE). Other treatments may include:  Nonsteroidal anti-inflammatory drugs (NSAIDs).  Physical therapy to stretch and strengthen your leg muscles.  Shoe inserts (orthotics) to take stress off your knee.  A knee brace or knee support.  Surgery to remove damaged cartilage or move the patella to a better position. The need for surgery is rare. Follow these instructions at home:  Take medicines only as directed by your health care provider.  Rest your knee.  When resting, keep your knee raised above the level of your heart.  Avoid activities that cause knee pain.  Apply ice to the injured area:  Put ice in a plastic bag.  Place a towel between your skin and the bag.  Leave the ice on for 20 minutes, 2-3 times a day.  Use splints, braces, knee supports, or walking aids as directed by your health care provider.  Perform stretching and strengthening exercises  as directed by your health care provider or physical therapist.  Keep all follow-up visits as directed by your health care provider. This is important. Contact a health care provider if:  Your symptoms get worse.  You are not improving with home care. This information is not intended to replace advice given to you by your health care provider. Make sure you discuss any questions you have with your health care provider. Document  Released: 06/22/2009 Document Revised: 12/10/2015 Document Reviewed: 09/23/2013  2017 Elsevier

## 2016-09-01 NOTE — Progress Notes (Signed)
Pt here to establish care.

## 2016-09-01 NOTE — Progress Notes (Signed)
Lauren Burgess is a 31 y.o. female who presents to [facility] today to establish care.  She has been feeling more fatigued over the past months. She gets about 5 hours of sleep a night and still endorses fatigue even when she gets more sleep. She has a 30 month old daughter at home who does not sleep through the night, along with a 56 year old son. No SOB, palpitations, diaphoresis. She has had a slightly decreased appetite over the past few months and has lost 5-10 pounds; however, she is at her pre-pregnancy birth weight. She has chronically brittle hair. She is currently breastfeeding without issues and is not having periods.   She has also had right knee pain for the past year or so that occurs after running and goes away gradually over a few days. Pain is generalized but is worst laterally. She runs 15 miles a week and has been running for most of her life. She had distal IT band syndrome in her left leg previously and says this pain feels different. She has noticed any improvement with ibuprofen.  Health maintenance: Sees an OBGYN and had a normal pap smear recently. Got a flu shot this year (works as Software engineer at [employer]) and Tdap with her last pregnancy.  Past Medical History:  Diagnosis Date  . Headache(784.0)   . Medical history non-contributory   . Postpartum care following vaginal delivery (7/19) 02/03/2016  . Raynaud disease    Past Surgical History:  Procedure Laterality Date  . NO PAST SURGERIES    . TONSILLECTOMY    . TONSILLECTOMY AND ADENOIDECTOMY    . WOUND EXPLORATION Left 12/13/2013   Procedure: EXPLORATION AND REPAIR OF COMMON DIGITAL ARTERY AND NERVE. NEURLYSIS OF COMMON DIGITAL NERVE.;  Surgeon: Schuyler Amor, MD;  Location: [facility];  Service: Orthopedics;  Laterality: Left;   Social History  Substance Use Topics  . Smoking status: Never  Smoker  . Smokeless tobacco: Never Used  . Alcohol use Yes     Comment: occasional wine   family history includes Cancer in her maternal grandfather and maternal grandmother; Heart attack in her maternal grandfather; Hypertension in her maternal grandfather.  ROS as above: No headache, visual changes, nausea, vomiting, diarrhea, constipation, dizziness, abdominal pain, skin rash, fevers, chills, night sweats, weight loss, swollen lymph nodes, body aches, joint swelling, muscle aches, chest pain, shortness of breath, mood changes, visual or auditory hallucinations.    Medications: Current Outpatient Prescriptions  Medication Sig Dispense Refill  . acetaminophen (TYLENOL) 325 MG tablet Take 2 tablets (650 mg total) by mouth every 4 (four) hours as needed (for pain scale < 4).    . benzocaine-Menthol (DERMOPLAST) 20-0.5 % AERO Apply 1 application topically as needed for irritation (perineal discomfort).    . coconut oil OIL Apply 1 application topically as needed.  0  . ibuprofen (ADVIL,MOTRIN) 600 MG tablet Take 1 tablet (600 mg total) by mouth every 6 (six) hours. 30 tablet 0  . Prenatal Vit-Fe Fumarate-FA (PRENATAL MULTIVITAMIN) TABS tablet Take 1 tablet by mouth at bedtime.    . senna-docusate (SENOKOT-S) 8.6-50 MG tablet Take 2 tablets by mouth daily.     No current facility-administered medications for this visit.    No Known Allergies  Health Maintenance Health Maintenance  Topic Date Due  . TETANUS/TDAP  10/16/2003  . PAP SMEAR  10/15/2005  . INFLUENZA VACCINE  02/16/2016  . HIV Screening  Completed     Exam:  BP 115/64 (BP Location: Right Arm, Patient Position: Sitting, Cuff Size: Normal)   Pulse 68   Temp 98.9 F (37.2 C) (Oral)   Wt 110 lb (49.9 kg)   SpO2 99%   BMI 19.49 kg/m  Gen: Well NAD HEENT: EOMI,  MMM no goiter Lungs: Normal work of breathing. CTABL Heart: RRR no MRG Abd: NABS, Soft. Nondistended, Nontender Exts: Brisk capillary refill, warm and well  perfused.  MSK:  Right knee: No effusion Nontender to palpation Patellar compression test negative Full, painless ROM 1+ Crepitus on knee extension No ligamentous instability; negative McMurray's test Weakness on resisted hip abduction   No results found for this or any previous visit (from the past 72 hour(s)). No results found.    Assessment and Plan: 31 y.o. female with   Fatigue: Occurring over past few months in setting of recent childbirth, without obvious signs or symptoms of hyper/hypothyroidism. Consider iron deficiency anemia, vit D deficiency, thyroid issue, overtraining with running. Hormone deficiency possible but less likely given normal lactation.  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  - Quad and hip abductor strengthening exercises   Orders Placed This Encounter  Procedures  . CBC  . COMPLETE METABOLIC PANEL WITH GFR  . Ferritin  . TSH  . Iron and TIBC  . Progesterone  . Estrogens, total  . FSH/LH  . VITAMIN D 25 Hydroxy (Vit-D Deficiency, Fractures)   No orders of the defined types were placed in this encounter.    Discussed warning signs or symptoms. Please see discharge instructions. Patient expresses understanding.

## 2016-09-02 LAB — VITAMIN D 25 HYDROXY (VIT D DEFICIENCY, FRACTURES): Vit D, 25-Hydroxy: 37 ng/mL (ref 30–100)

## 2016-09-02 LAB — FSH/LH
FSH: 5.8 m[IU]/mL
LH: 2.8 m[IU]/mL

## 2016-09-02 LAB — PROGESTERONE: Progesterone: <0.5 ng/mL

## 2016-09-06 LAB — ESTROGENS, TOTAL: ESTROGEN: 193.1 pg/mL

## 2017-01-13 DIAGNOSIS — H5213 Myopia, bilateral: Secondary | ICD-10-CM | POA: Diagnosis not present

## 2017-01-27 MED FILL — AZITHROMYCIN 500 MG TABLET: 500 | 3 days supply | Qty: 3 | Fill #0

## 2017-01-27 MED FILL — ATOVAQUONE-PROGUANIL 250-10: 250-100 | 20 days supply | Qty: 20 | Fill #0

## 2017-03-03 ENCOUNTER — Ambulatory Visit (INDEPENDENT_AMBULATORY_CARE_PROVIDER_SITE_OTHER): Payer: 59

## 2017-03-03 ENCOUNTER — Ambulatory Visit (INDEPENDENT_AMBULATORY_CARE_PROVIDER_SITE_OTHER): Payer: 59 | Admitting: Family Medicine

## 2017-03-03 VITALS — BP 115/71 | HR 70 | Temp 98.3°F | Wt 108.0 lb

## 2017-03-03 DIAGNOSIS — R05 Cough: Secondary | ICD-10-CM | POA: Diagnosis not present

## 2017-03-03 DIAGNOSIS — R059 Cough, unspecified: Secondary | ICD-10-CM

## 2017-03-03 MED ORDER — GUAIFENESIN-CODEINE 100-10 MG/5ML PO SOLN: 5.0000 mL | Freq: Every evening | ORAL | 0 refills | Status: DC | PRN

## 2017-03-03 MED ORDER — AZITHROMYCIN 250 MG PO TABS: 250.0000 mg | ORAL_TABLET | Freq: Every day | ORAL | 0 refills | Status: DC

## 2017-03-03 MED ORDER — BENZONATATE 200 MG PO CAPS: 200.0000 mg | ORAL_CAPSULE | Freq: Three times a day (TID) | ORAL | 4 refills | Status: DC | PRN

## 2017-03-03 MED ORDER — PREDNISONE 10 MG PO TABS: 30.0000 mg | ORAL_TABLET | Freq: Every day | ORAL | 0 refills | Status: DC

## 2017-03-03 NOTE — Progress Notes (Signed)
Lauren Burgess is a 31 y.o. female who presents to [facility] today for cough congestion runny nose. Symptoms present for 2 weeks following a trip to [place]. Her husband is sick with a similar illness and was recently diagnosed with pneumonia. The cough is productive and very bothersome at bedtime. 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 She has tried over-the-counter medications for cough which have not helped much. She feels well otherwise. No fevers or chills.   Past Medical History:  Diagnosis Date  . Headache(784.0)   . Medical history non-contributory   . Postpartum care following vaginal delivery (7/19) 02/03/2016  . Raynaud disease    Past Surgical History:  Procedure Laterality Date  . NO PAST SURGERIES    . TONSILLECTOMY    . TONSILLECTOMY AND ADENOIDECTOMY    . WOUND EXPLORATION Left 12/13/2013   Procedure: EXPLORATION AND REPAIR OF COMMON DIGITAL ARTERY AND NERVE. NEURLYSIS OF COMMON DIGITAL NERVE.;  Surgeon: Schuyler Amor, MD;  Location: [facility];  Service: Orthopedics;  Laterality: Left;   Social History  Substance Use Topics  . Smoking status: Never Smoker  . Smokeless tobacco: Never Used  . Alcohol use Yes     Comment: occasional wine   family history includes Cancer in her maternal grandfather and maternal grandmother; Heart attack in her maternal grandfather; Hypertension in her maternal grandfather.  ROS as above:  Medications: Current Outpatient Prescriptions  Medication Sig Dispense Refill  . acetaminophen (TYLENOL) 325 MG tablet Take 2 tablets (650 mg total) by mouth every 4 (four) hours as needed (for pain scale < 4).    Marland Kitchen azithromycin (ZITHROMAX) 250 MG tablet Take 1 tablet (250 mg total) by mouth daily. Take first 2 tablets together, then 1 every day until finished. 6 tablet 0  . benzonatate  (TESSALON) 200 MG capsule Take 1 capsule (200 mg total) by mouth 3 (three) times daily as needed for cough. 45 capsule 4  . guaiFENesin-codeine 100-10 MG/5ML syrup Take 5 mLs by mouth at bedtime as needed for cough. 120 mL 0  . ibuprofen (ADVIL,MOTRIN) 600 MG tablet Take 1 tablet (600 mg total) by mouth every 6 (six) hours. 30 tablet 0  . predniSONE (DELTASONE) 10 MG tablet Take 3 tablets (30 mg total) by mouth daily with breakfast. 15 tablet 0   No current facility-administered medications for this visit.    No Known Allergies  Health Maintenance Health Maintenance  Topic Date Due  . PAP SMEAR  10/15/2005  . INFLUENZA VACCINE  02/15/2017  . TETANUS/TDAP  07/18/2025  . HIV Screening  Completed     Exam:  BP 115/71   Pulse 70   Temp 98.3 F (36.8 C) (Oral)   Wt 108 lb (49 kg)   LMP 02/05/2017   SpO2 100%   BMI 19.13 kg/m  Gen: Well NAD HEENT: EOMI,  MMM Clear nasal discharge. Posterior pharynx with cobblestoning. Mild cervical lymphadenopathy present bilaterally. Lungs: Normal work of breathing. Coarse breath sounds throughout. Heart: RRR no MRG Abd: NABS, Soft. Nondistended, Nontender Exts: Brisk capillary refill, warm and well perfused.    Chest x-ray does shows bronchitic changes with no obvious infiltrate. Awaiting formal radiology review   Assessment and Plan: 31 y.o. female with cough likely viral bronchitis. Plan for treatment with codeine and Tessalon Perles. If symptoms worsen or fail to improve I've also prescribed and printed prednisone and azithromycin. We discussed when  to use these medications. Patient is a Nurse, adult. We also discussed risks of antibiotics and prednisone of which she is well aware.  New problem uncertain diagnosis.   Orders Placed This Encounter  Procedures  . DG Chest 2 View    Order Specific Question:   Reason for exam:    Answer:   Cough, assess intra-thoracic pathology    Order Specific Question:   Is the patient pregnant?     Answer:   No    Order Specific Question:   Preferred imaging location?    Answer:   [facility]   Meds ordered this encounter  Medications  . guaiFENesin-codeine 100-10 MG/5ML syrup    Sig: Take 5 mLs by mouth at bedtime as needed for cough.    Dispense:  120 mL    Refill:  0  . benzonatate (TESSALON) 200 MG capsule    Sig: Take 1 capsule (200 mg total) by mouth 3 (three) times daily as needed for cough.    Dispense:  45 capsule    Refill:  4  . azithromycin (ZITHROMAX) 250 MG tablet    Sig: Take 1 tablet (250 mg total) by mouth daily. Take first 2 tablets together, then 1 every day until finished.    Dispense:  6 tablet    Refill:  0  . predniSONE (DELTASONE) 10 MG tablet    Sig: Take 3 tablets (30 mg total) by mouth daily with breakfast.    Dispense:  15 tablet    Refill:  0     Discussed warning signs or symptoms. Please see discharge instructions. Patient expresses understanding.

## 2017-03-03 NOTE — Patient Instructions (Signed)
Thank you for coming in today. Take codeine and tessalon as needed.  Use prednisone and azithromycin as needed.  Call or go to the emergency room if you get worse, have trouble breathing, have chest pains, or palpitations.    Cough, Adult Coughing is a reflex that clears your throat and your airways. Coughing helps to heal and protect your lungs. It is normal to cough occasionally, but a cough that happens with other symptoms or lasts a long time may be a sign of a condition that needs treatment. A cough may last only 2-3 weeks (acute), or it may last longer than 8 weeks (chronic). What are the causes? Coughing is commonly caused by:  Breathing in substances that irritate your lungs.  A viral or bacterial respiratory infection.  Allergies.  Asthma.  Postnasal drip.  Smoking.  Acid backing up from the stomach into the esophagus (gastroesophageal reflux).  Certain medicines.  Chronic lung problems, including COPD (or rarely, lung cancer).  Other medical conditions such as heart failure.  Follow these instructions at home: Pay attention to any changes in your symptoms. Take these actions to help with your discomfort:  Take medicines only as told by your health care provider. ? If you were prescribed an antibiotic medicine, take it as told by your health care provider. Do not stop taking the antibiotic even if you start to feel better. ? Talk with your health care provider before you take a cough suppressant medicine.  Drink enough fluid to keep your urine clear or pale yellow.  If the air is dry, use a cold steam vaporizer or humidifier in your bedroom or your home to help loosen secretions.  Avoid anything that causes you to cough at work or at home.  If your cough is worse at night, try sleeping in a semi-upright position.  Avoid cigarette smoke. If you smoke, quit smoking. If you need help quitting, ask your health care provider.  Avoid caffeine.  Avoid  alcohol.  Rest as needed.  Contact a health care provider if:  You have new symptoms.  You cough up pus.  Your cough does not get better after 2-3 weeks, or your cough gets worse.  You cannot control your cough with suppressant medicines and you are losing sleep.  You develop pain that is getting worse or pain that is not controlled with pain medicines.  You have a fever.  You have unexplained weight loss.  You have night sweats. Get help right away if:  You cough up blood.  You have difficulty breathing.  Your heartbeat is very fast. This information is not intended to replace advice given to you by your health care provider. Make sure you discuss any questions you have with your health care provider. Document Released: 12/31/2010 Document Revised: 12/10/2015 Document Reviewed: 09/10/2014 Elsevier Interactive Patient Education  2017 [employer].

## 2017-08-23 ENCOUNTER — Encounter: Payer: Self-pay | Admitting: Physician Assistant

## 2017-08-23 ENCOUNTER — Ambulatory Visit (INDEPENDENT_AMBULATORY_CARE_PROVIDER_SITE_OTHER): Payer: No Typology Code available for payment source | Admitting: Physician Assistant

## 2017-08-23 VITALS — BP 128/79 | HR 77 | Temp 98.2°F | Wt 113.0 lb

## 2017-08-23 DIAGNOSIS — J101 Influenza due to other identified influenza virus with other respiratory manifestations: Secondary | ICD-10-CM | POA: Insufficient documentation

## 2017-08-23 LAB — POCT INFLUENZA A/B
INFLUENZA A, POC: POSITIVE — AB
INFLUENZA B, POC: NEGATIVE

## 2017-08-23 MED ORDER — OSELTAMIVIR PHOSPHATE 75 MG PO CAPS: 75.0000 mg | ORAL_CAPSULE | Freq: Two times a day (BID) | ORAL | 0 refills | Status: DC

## 2017-08-23 NOTE — Progress Notes (Signed)
HPI:                                                                Lauren Burgess is a 32 y.o. female who presents to [facility] today for influenza-like illness  Influenza  This is a new problem. The current episode started in the past 7 days. The problem occurs constantly. The problem has been gradually worsening. Associated symptoms include chills, congestion, coughing (mildly productive), fatigue, headaches and myalgias. Pertinent negatives include no abdominal pain, chest pain, fever, nausea, neck pain or vomiting. Nothing aggravates the symptoms. She has tried NSAIDs and rest for the symptoms.  Patient reports multiple sick household contacts, husband diagnosed with flu today.   No flowsheet data found.  No flowsheet data found.    Past Medical History:  Diagnosis Date  . Headache(784.0)   . Medical history non-contributory   . Postpartum care following vaginal delivery (7/19) 02/03/2016  . Raynaud disease    Past Surgical History:  Procedure Laterality Date  . NO PAST SURGERIES    . TONSILLECTOMY    . TONSILLECTOMY AND ADENOIDECTOMY    . WOUND EXPLORATION Left 12/13/2013   Procedure: EXPLORATION AND REPAIR OF COMMON DIGITAL ARTERY AND NERVE. NEURLYSIS OF COMMON DIGITAL NERVE.;  Surgeon: Schuyler Amor, MD;  Location: [facility];  Service: Orthopedics;  Laterality: Left;   Social History   Tobacco Use  . Smoking status: Never Smoker  . Smokeless tobacco: Never Used  Substance Use Topics  . Alcohol use: Yes    Comment: occasional wine   family history includes Cancer in her maternal grandfather and maternal grandmother; Heart attack in her maternal grandfather; Hypertension in her maternal grandfather.    ROS: negative except as noted in the HPI  Medications: Current Outpatient Medications  Medication Sig Dispense Refill  . acetaminophen (TYLENOL) 325 MG tablet Take 2 tablets (650 mg total) by mouth every 4  (four) hours as needed (for pain scale < 4).    . ibuprofen (ADVIL,MOTRIN) 600 MG tablet Take 1 tablet (600 mg total) by mouth every 6 (six) hours. 30 tablet 0  . oseltamivir (TAMIFLU) 75 MG capsule Take 1 capsule (75 mg total) by mouth 2 (two) times daily. 10 capsule 0   No current facility-administered medications for this visit.    No Known Allergies     Objective:  BP 128/79   Pulse 77   Temp 98.2 F (36.8 C) (Oral)   Wt 113 lb (51.3 kg)   LMP 08/21/2017 (Approximate)   SpO2 100%   Breastfeeding? No   BMI 20.02 kg/m  Gen:  alert, ill-appearing, not toxic-appearing, no distress, appropriate for age 89: head normocephalic without obvious abnormality, conjunctiva and cornea clear, TM's clear bilaterally, nasal mucosa edematous, oropharynx clear, no edema, tonsils absent, moist mucous membranes, neck supple, no adenopathy, trachea midline Pulm: Normal work of breathing, normal phonation, breath sounds diffusely coarse CV: Normal rate, regular rhythm, s1 and s2 distinct, no murmurs, clicks or rubs  Neuro: alert and oriented x 3, no tremor MSK: extremities atraumatic, normal gait and station Skin: intact, no rashes on exposed skin, no jaundice, no cyanosis     Results for orders placed or performed in visit on 08/23/17 (from the  past 72 hour(s))  POCT Influenza A/B     Status: Abnormal   Collection Time: 08/23/17  4:43 PM  Result Value Ref Range   Influenza A, POC Positive (A) Negative   Influenza B, POC Negative Negative   No results found.    Assessment and Plan: 32 y.o. female with   1. Influenza A - she is within the window for Tamiflu - continue symptomatic care - oseltamivir (TAMIFLU) 75 MG capsule; Take 1 capsule (75 mg total) by mouth 2 (two) times daily.  Dispense: 10 capsule; Refill: 0   Patient education and anticipatory guidance given Patient agrees with treatment plan Follow-up as needed if symptoms worsen or fail to improve  Darlyne Russian  PA-C

## 2017-08-23 NOTE — Patient Instructions (Signed)

## 2017-12-28 ENCOUNTER — Ambulatory Visit (INDEPENDENT_AMBULATORY_CARE_PROVIDER_SITE_OTHER): Payer: No Typology Code available for payment source | Admitting: Family Medicine

## 2017-12-28 ENCOUNTER — Encounter: Payer: Self-pay | Admitting: Family Medicine

## 2017-12-28 VITALS — BP 118/75 | HR 69 | Ht 63.5 in | Wt 111.0 lb

## 2017-12-28 DIAGNOSIS — Z83438 Family history of other disorder of lipoprotein metabolism and other lipidemia: Secondary | ICD-10-CM

## 2017-12-28 DIAGNOSIS — Z0000 Encounter for general adult medical examination without abnormal findings: Secondary | ICD-10-CM

## 2017-12-28 NOTE — Progress Notes (Signed)
       Lauren Burgess is a 32 y.o. female who presents to [facility] today for well adult visit.  Lauren Burgess is a healthy 32 year old woman G2P2.  She takes prenatal vitamins but otherwise does not take much medications besides ibuprofen occasionally.  She prevents pregnancy by using condoms but is thinking about having a child in the near future.  Her OB/GYN provider is at [facility].  She has an appointment scheduled in about a week for a Pap smear.  She exercises regularly by running or doing calisthenics.  She eats a healthy diet.   ROS as above:  Exam:  BP 118/75   Pulse 69   Ht 5' 3.5" (1.613 m)   Wt 111 lb (50.3 kg)   BMI 19.35 kg/m   Wt Readings from Last 5 Encounters:  12/28/17 111 lb (50.3 kg)  08/23/17 113 lb (51.3 kg)  03/03/17 108 lb (49 kg)  09/01/16 110 lb (49.9 kg)  02/03/16 132 lb (59.9 kg)    Gen: Well NAD HEENT: EOMI,  MMM Lungs: Normal work of breathing. CTABL Heart: RRR no MRG Abd: NABS, Soft. Nondistended, Nontender Exts: Brisk capillary refill, warm and well perfused.  MSK: Excellent quadriceps bulk and excellent strength  Depression screen [facility] 2/9 12/28/2017  Decreased Interest 0  Down, Depressed, Hopeless 0  PHQ - 2 Score 0     Lab and Radiology Results Labs from 2017 and 2018 reviewed with patient  Assessment and Plan: 32 y.o. female with  Well adult visit.  Patient is doing quite well with no issues today.  Discussed future plans and injury avoiding strategies for exercise.  Recommend regular Vit-D supplementation Recheck in 1 year.  CC: [facility].     Historical information moved to improve visibility of documentation.  Past Medical History:  Diagnosis Date  . Headache(784.0)   . Medical history non-contributory   . Raynaud disease    Past Surgical History:  Procedure Laterality Date  . TONSILLECTOMY AND  ADENOIDECTOMY    . WOUND EXPLORATION Left 12/13/2013   Procedure: EXPLORATION AND REPAIR OF COMMON DIGITAL ARTERY AND NERVE. NEURLYSIS OF COMMON DIGITAL NERVE.;  Surgeon: Schuyler Amor, MD;  Location: [facility];  Service: Orthopedics;  Laterality: Left;   Social History   Tobacco Use  . Smoking status: Never Smoker  . Smokeless tobacco: Never Used  Substance Use Topics  . Alcohol use: Yes    Comment: occasional wine   family history includes Cancer in her maternal grandfather; Cancer (age of onset: 37) in her maternal grandmother; Heart attack in her maternal grandfather; Hypertension in her maternal grandfather.  Medications: Current Outpatient Medications  Medication Sig Dispense Refill  . acetaminophen (TYLENOL) 325 MG tablet Take 2 tablets (650 mg total) by mouth every 4 (four) hours as needed (for pain scale < 4).    . ibuprofen (ADVIL,MOTRIN) 600 MG tablet Take 1 tablet (600 mg total) by mouth every 6 (six) hours. 30 tablet 0   No current facility-administered medications for this visit.    No Known Allergies  Health Maintenance Health Maintenance  Topic Date Due  . PAP SMEAR  10/15/2005  . INFLUENZA VACCINE  02/15/2018  . TETANUS/TDAP  07/18/2025  . HIV Screening  Completed    Discussed warning signs or symptoms. Please see discharge instructions. Patient expresses understanding.   CC: Chemical engineer

## 2017-12-28 NOTE — Patient Instructions (Addendum)
Thank you for coming in today. Recheck yearly as needed.   Make sure OBGYN sends records.   We will send a copy of this note as well.

## 2018-01-05 LAB — HM PAP SMEAR: HM Pap smear: NEGATIVE

## 2018-02-16 LAB — OB RESULTS CONSOLE HEPATITIS B SURFACE ANTIGEN: Hepatitis B Surface Ag: NEGATIVE

## 2018-02-16 LAB — OB RESULTS CONSOLE GC/CHLAMYDIA
Chlamydia: NEGATIVE
GC PROBE AMP, GENITAL: NEGATIVE

## 2018-02-16 LAB — OB RESULTS CONSOLE HIV ANTIBODY (ROUTINE TESTING): HIV: NONREACTIVE

## 2018-02-16 LAB — OB RESULTS CONSOLE RPR: RPR: NONREACTIVE

## 2018-02-16 LAB — OB RESULTS CONSOLE RUBELLA ANTIBODY, IGM: RUBELLA: IMMUNE

## 2018-07-18 NOTE — L&D Delivery Note (Signed)
Delivery Note Pushed twice. At 11:08 PM a viable and healthy female was delivered via Vaginal, Spontaneous (Presentation: LOA ).  APGAR: 9, 9; weight  pending  Placenta status: spontaneous, complete.  Cord:  with the following complications: None.  Cord pH: N/A Anesthesia:  Epidural  Episiotomy: None Lacerations: 1st degree Suture Repair: 3.0 vicryl rapide Est. Blood Loss (mL):  100 cc  Mom to postpartum.  Baby to Couplet care / Skin to Skin.  [name] 09/03/2018, 11:27 PM

## 2018-08-11 LAB — OB RESULTS CONSOLE GBS: GBS: POSITIVE

## 2018-08-23 ENCOUNTER — Telehealth (HOSPITAL_COMMUNITY): Payer: Self-pay | Admitting: *Deleted

## 2018-08-23 ENCOUNTER — Encounter (HOSPITAL_COMMUNITY): Payer: Self-pay | Admitting: *Deleted

## 2018-08-23 NOTE — Telephone Encounter (Signed)
Preadmission screen  

## 2018-08-24 ENCOUNTER — Encounter (HOSPITAL_COMMUNITY): Payer: Self-pay | Admitting: *Deleted

## 2018-09-03 ENCOUNTER — Inpatient Hospital Stay (HOSPITAL_COMMUNITY): Payer: No Typology Code available for payment source | Admitting: Anesthesiology

## 2018-09-03 ENCOUNTER — Encounter (HOSPITAL_COMMUNITY): Payer: Self-pay | Admitting: *Deleted

## 2018-09-03 ENCOUNTER — Other Ambulatory Visit: Payer: Self-pay

## 2018-09-03 ENCOUNTER — Inpatient Hospital Stay (HOSPITAL_COMMUNITY)
Admission: AD | Admit: 2018-09-03 | Discharge: 2018-09-05 | DRG: 807 | Disposition: A | Discharge status: Home / Self Care | Payer: No Typology Code available for payment source | Attending: Obstetrics & Gynecology | Admitting: Obstetrics & Gynecology

## 2018-09-03 ENCOUNTER — Other Ambulatory Visit: Payer: Self-pay | Admitting: Obstetrics

## 2018-09-03 DIAGNOSIS — O99824 Streptococcus B carrier state complicating childbirth: Principal | ICD-10-CM | POA: Diagnosis present

## 2018-09-03 DIAGNOSIS — O9912 Other diseases of the blood and blood-forming organs and certain disorders involving the immune mechanism complicating childbirth: Secondary | ICD-10-CM | POA: Diagnosis present

## 2018-09-03 DIAGNOSIS — Z3A39 39 weeks gestation of pregnancy: Secondary | ICD-10-CM | POA: Diagnosis not present

## 2018-09-03 DIAGNOSIS — D6959 Other secondary thrombocytopenia: Secondary | ICD-10-CM | POA: Diagnosis present

## 2018-09-03 DIAGNOSIS — Z3490 Encounter for supervision of normal pregnancy, unspecified, unspecified trimester: Secondary | ICD-10-CM

## 2018-09-03 DIAGNOSIS — Z3483 Encounter for supervision of other normal pregnancy, third trimester: Secondary | ICD-10-CM | POA: Diagnosis present

## 2018-09-03 LAB — CBC
HCT: 39.5 % (ref 36.0–46.0)
Hemoglobin: 13.3 g/dL (ref 12.0–15.0)
MCH: 31.4 pg (ref 26.0–34.0)
MCHC: 33.7 g/dL (ref 30.0–36.0)
MCV: 93.2 fL (ref 80.0–100.0)
Platelets: 139 10*3/uL — ABNORMAL LOW (ref 150–400)
RBC: 4.24 MIL/uL (ref 3.87–5.11)
RDW: 13.7 % (ref 11.5–15.5)
WBC: 9 10*3/uL (ref 4.0–10.5)
nRBC: 0 % (ref 0.0–0.2)

## 2018-09-03 LAB — TYPE AND SCREEN
ABO/RH(D): O POS
Antibody Screen: NEGATIVE

## 2018-09-03 MED ORDER — LIDOCAINE HCL (PF) 1 % IJ SOLN
30.0000 mL | INTRAMUSCULAR | Status: DC | PRN
  Filled 2018-09-03: qty 30

## 2018-09-03 MED ORDER — DIPHENHYDRAMINE HCL 50 MG/ML IJ SOLN: 12.5000 mg | INTRAMUSCULAR | Status: DC | PRN

## 2018-09-03 MED ORDER — FENTANYL 2.5 MCG/ML BUPIVACAINE 1/10 % EPIDURAL INFUSION (WH - ANES)
INTRAMUSCULAR | Status: AC
  Filled 2018-09-03: qty 100

## 2018-09-03 MED ORDER — EPHEDRINE 5 MG/ML INJ
10.0000 mg | INTRAVENOUS | Status: DC | PRN
  Filled 2018-09-03: qty 2

## 2018-09-03 MED ORDER — OXYCODONE-ACETAMINOPHEN 5-325 MG PO TABS: 1.0000 | ORAL_TABLET | ORAL | Status: DC | PRN

## 2018-09-03 MED ORDER — ACETAMINOPHEN 325 MG PO TABS: 650.0000 mg | ORAL_TABLET | ORAL | Status: DC | PRN

## 2018-09-03 MED ORDER — LIDOCAINE HCL (PF) 1 % IJ SOLN
INTRAMUSCULAR | Status: DC | PRN
  Administered 2018-09-03 (×2): 5 mL via EPIDURAL

## 2018-09-03 MED ORDER — LACTATED RINGERS IV SOLN: 500.0000 mL | INTRAVENOUS | Status: DC | PRN

## 2018-09-03 MED ORDER — LACTATED RINGERS IV SOLN: 500.0000 mL | Freq: Once | INTRAVENOUS | Status: DC

## 2018-09-03 MED ORDER — PHENYLEPHRINE 40 MCG/ML (10ML) SYRINGE FOR IV PUSH (FOR BLOOD PRESSURE SUPPORT)
80.0000 ug | PREFILLED_SYRINGE | INTRAVENOUS | Status: DC | PRN
  Filled 2018-09-03: qty 10

## 2018-09-03 MED ORDER — PHENYLEPHRINE 40 MCG/ML (10ML) SYRINGE FOR IV PUSH (FOR BLOOD PRESSURE SUPPORT)
PREFILLED_SYRINGE | INTRAVENOUS | Status: AC
  Filled 2018-09-03: qty 10

## 2018-09-03 MED ORDER — OXYCODONE-ACETAMINOPHEN 5-325 MG PO TABS: 2.0000 | ORAL_TABLET | ORAL | Status: DC | PRN

## 2018-09-03 MED ORDER — PENICILLIN G 3 MILLION UNITS IVPB - SIMPLE MED
3.0000 10*6.[IU] | INTRAVENOUS | Status: DC
  Administered 2018-09-03 (×2): 3 10*6.[IU] via INTRAVENOUS
  Filled 2018-09-03 (×8): qty 100

## 2018-09-03 MED ORDER — OXYTOCIN 40 UNITS IN NORMAL SALINE INFUSION - SIMPLE MED
1.0000 m[IU]/min | INTRAVENOUS | Status: DC
  Administered 2018-09-03: 2 m[IU]/min via INTRAVENOUS
  Filled 2018-09-03: qty 1000

## 2018-09-03 MED ORDER — LACTATED RINGERS IV SOLN
INTRAVENOUS | Status: DC
  Administered 2018-09-03: 14:00:00 via INTRAVENOUS

## 2018-09-03 MED ORDER — ONDANSETRON HCL 4 MG/2ML IJ SOLN: 4.0000 mg | Freq: Four times a day (QID) | INTRAMUSCULAR | Status: DC | PRN

## 2018-09-03 MED ORDER — SODIUM CHLORIDE 0.9 % IV SOLN
5.0000 10*6.[IU] | Freq: Once | INTRAVENOUS | Status: AC
  Administered 2018-09-03: 5 10*6.[IU] via INTRAVENOUS
  Filled 2018-09-03: qty 5

## 2018-09-03 MED ORDER — OXYTOCIN 40 UNITS IN NORMAL SALINE INFUSION - SIMPLE MED: 2.5000 [IU]/h | INTRAVENOUS | Status: DC

## 2018-09-03 MED ORDER — FENTANYL 2.5 MCG/ML BUPIVACAINE 1/10 % EPIDURAL INFUSION (WH - ANES)
INTRAMUSCULAR | Status: DC | PRN
  Administered 2018-09-03: 14 mL/h via EPIDURAL

## 2018-09-03 MED ORDER — FENTANYL-BUPIVACAINE-NACL 0.5-0.125-0.9 MG/250ML-% EP SOLN: 14.0000 mL/h | EPIDURAL | Status: DC | PRN

## 2018-09-03 MED ORDER — OXYTOCIN BOLUS FROM INFUSION
500.0000 mL | Freq: Once | INTRAVENOUS | Status: AC
  Administered 2018-09-03: 500 mL via INTRAVENOUS

## 2018-09-03 MED ORDER — TERBUTALINE SULFATE 1 MG/ML IJ SOLN
0.2500 mg | Freq: Once | INTRAMUSCULAR | Status: DC | PRN
  Filled 2018-09-03: qty 1

## 2018-09-03 MED ORDER — SOD CITRATE-CITRIC ACID 500-334 MG/5ML PO SOLN: 30.0000 mL | ORAL | Status: DC | PRN

## 2018-09-03 NOTE — Anesthesia Preprocedure Evaluation (Signed)
Anesthesia Evaluation  Patient identified by MRN, date of birth, ID band Patient awake    Reviewed: Allergy & Precautions, H&P , NPO status , Patient's Chart, lab work & pertinent test results  History of Anesthesia Complications Negative for: history of anesthetic complications  Airway Mallampati: II  TM Distance: >3 FB Neck ROM: full    Dental no notable dental hx. (+) Teeth Intact   Pulmonary neg pulmonary ROS,    Pulmonary exam normal breath sounds clear to auscultation       Cardiovascular negative cardio ROS Normal cardiovascular exam Rhythm:regular Rate:Normal     Neuro/Psych negative neurological ROS  negative psych ROS   GI/Hepatic negative GI ROS, Neg liver ROS,   Endo/Other  negative endocrine ROS  Renal/GU negative Renal ROS  negative genitourinary   Musculoskeletal Raynaud disease   Abdominal   Peds  Hematology negative hematology ROS (+)   Anesthesia Other Findings   Reproductive/Obstetrics (+) Pregnancy                             Anesthesia Physical Anesthesia Plan  ASA: II  Anesthesia Plan: Epidural   Post-op Pain Management:    Induction:   PONV Risk Score and Plan:   Airway Management Planned:   Additional Equipment:   Intra-op Plan:   Post-operative Plan:   Informed Consent: I have reviewed the patients History and Physical, chart, labs and discussed the procedure including the risks, benefits and alternatives for the proposed anesthesia with the patient or authorized representative who has indicated his/her understanding and acceptance.       Plan Discussed with:   Anesthesia Plan Comments:         Anesthesia Quick Evaluation

## 2018-09-03 NOTE — Progress Notes (Signed)
Lauren Burgess is a 33 y.o. G3P2002 at [redacted]w[redacted]d, latent labor admit due to h/o rapid labors and GBS(+) Subjective: UCs getting stronger    Objective: BP 111/66   Pulse (!) 57   Temp 98.3 F (36.8 C) (Oral)   Resp 18   Ht 5' 3.5" (1.613 m)   Wt 59 kg   SpO2 99%   BMI 22.67 kg/m   FHT:  FHR: 130s bpm, variability: moderate,  accelerations:  Present,  decelerations:  Absent UC:   regular, every 3 minutes SVE:  4-5/ 80%/ -2/ VX. AROM clear fluid  Assessment / Plan: Augmentation of labor, progressing well, low dose pitocin at 6 mu. Ready for epidural GBS(+), PCN per protocol, 2 doses done Anticipated MOD:  NSVD  Lauren Burgess 09/03/2018, 8:23 PM

## 2018-09-03 NOTE — Anesthesia Pain Management Evaluation Note (Signed)
  CRNA Pain Management Visit Note  Patient: Lauren Burgess, 33 y.o., female  "Hello I am a member of the anesthesia team at [facility]. We have an anesthesia team available at all times to provide care throughout the hospital, including epidural management and anesthesia for C-section. I don't know your plan for the delivery whether it a natural birth, water birth, IV sedation, nitrous supplementation, doula or epidural, but we want to meet your pain goals."   1.Was your pain managed to your expectations on prior hospitalizations?   Yes   2.What is your expectation for pain management during this hospitalization?     Epidural  3.How can we help you reach that goal? epidural  Record the patient's initial score and the patient's pain goal.   Pain: 4  Pain Goal: 5 [facility] wants you to be able to say your pain was always managed very well.  Kristy Catoe 09/03/2018

## 2018-09-03 NOTE — MAU Note (Signed)
Pt presents to MAU with complaints of contractions since 8 this morning

## 2018-09-03 NOTE — H&P (Addendum)
Lauren Burgess is a 33 y.o. female presenting for early labor but getting more uncomfortable with pain. H/o rapid labors, SVD largest 8'3". Now GBS(+), so admit, start PCN and assess progress.  Preg care- [facility] from 7 wks. Echogenic bowel at 19 wks. TORCH, Toxo, CF screen neg. Echogenoc bowel resolved at 23 wks. Growth AGA at 31 wks. 3'15" at 61%   OB History    Gravida  3   Para  2   Term  2   Preterm      AB      Living  2     SAB      TAB      Ectopic      Multiple  0   Live Births  2          Past Medical History:  Diagnosis Date  . Headache(784.0)   . Medical history non-contributory   . Raynaud disease    Past Surgical History:  Procedure Laterality Date  . TONSILLECTOMY AND ADENOIDECTOMY    . WOUND EXPLORATION Left 12/13/2013   Procedure: EXPLORATION AND REPAIR OF COMMON DIGITAL ARTERY AND NERVE. NEURLYSIS OF COMMON DIGITAL NERVE.;  Surgeon: Schuyler Amor, MD;  Location: [facility];  Service: Orthopedics;  Laterality: Left;   Family History: family history includes Cancer in her maternal grandfather; Cancer (age of onset: 43) in her maternal grandmother; Heart attack in her maternal grandfather; Heart disease in her maternal grandfather; Hypertension in her maternal grandfather; Stroke in her paternal grandmother. Social History:  reports that she has never smoked. She has never used smokeless tobacco. She reports current alcohol use. She reports that she does not use drugs.     Maternal Diabetes: No Genetic Screening: Normal  AFP4. Also did CF screen- is negative  Maternal Ultrasounds/Referrals: Abnormal:  Findings:   Echogenic bowel at 19 wks, resolved at f/up in 4 wks. Normal growth at 31-32 wks. CF scr neg. TORCH Toxo neg Fetal Ultrasounds or other Referrals:  None Maternal Substance Abuse:  No Significant Maternal Medications:  None Significant Maternal Lab Results:  Lab values include: Group B Strep positive Other Comments:   None  ROS History Dilation: 3 Effacement (%): 50 Station: -2 Exam by:: [place],RN Blood pressure 121/71, pulse 65, temperature 97.8 F (36.6 C), temperature source Oral, resp. rate 16, height 5' 3.5" (1.613 m), weight 59 kg, SpO2 99 %. Exam Physical Exam  Physical exam:  A&O x 3, no acute distress. [place] HEENT neg, no thyromegaly Lungs CTA bilat CV RRR, S1S2 normal Abdo soft, non tender, non acute Extr no edema/ tenderness Pelvic above FHT 130s + accels rare variable decels mod variab- cat I Toco irreg, q 3-4 min  Prenatal labs: ABO, Rh:  O(+) Antibody:  Neg  Rubella: Immune (08/02 0000) RPR: Nonreactive (08/02 0000)  HBsAg: Negative (08/02 0000)  HIV: Non-reactive (08/02 0000)  GBS: Positive (01/25 0000)   Assessment/Plan: 33 yo G3P2002, at 39 wks well dated by 1st trim sono. Here at 39 wks in early labor, pain getting worse with h/o rapid labors. GBS(+), admit, PCN, assess for spontaneous labor, augment if needed after PCN at 3-4 hrs.  FHT cat I Epidural by her choice Anticipate SVD 8 lbs   Lauren Burgess 09/03/2018, 1:30 PM

## 2018-09-03 NOTE — Anesthesia Procedure Notes (Signed)
Epidural Patient location during procedure: OB Start time: 09/03/2018 8:21 PM End time: 09/03/2018 8:31 PM  Staffing Anesthesiologist: Murvin Natal, MD Performed: anesthesiologist   Preanesthetic Checklist Completed: patient identified, site marked, pre-op evaluation, timeout performed, IV checked, risks and benefits discussed and monitors and equipment checked  Epidural Patient position: sitting Prep: DuraPrep Patient monitoring: heart rate, cardiac monitor, continuous pulse ox and blood pressure Approach: midline Location: L4-L5 Injection technique: LOR air  Needle:  Needle type: Tuohy  Needle gauge: 17 G Needle length: 9 cm Needle insertion depth: 5 cm Catheter type: closed end flexible Catheter size: 19 Gauge Catheter at skin depth: 10 cm Test dose: negative and Other  Assessment Events: blood not aspirated, injection not painful, no injection resistance and negative IV test  Additional Notes Informed consent obtained prior to proceeding including risk of failure, 1% risk of PDPH, risk of minor discomfort and bruising. Discussed alternatives to epidural analgesia and patient desires to proceed.  Timeout performed pre-procedure verifying patient name, procedure, and platelet count.  Patient tolerated procedure well. Reason for block:procedure for pain

## 2018-09-04 ENCOUNTER — Inpatient Hospital Stay (HOSPITAL_COMMUNITY)
Admission: RE | Admit: 2018-09-04 | Payer: No Typology Code available for payment source | Source: Ambulatory Visit | Admitting: Obstetrics

## 2018-09-04 ENCOUNTER — Encounter (HOSPITAL_COMMUNITY): Payer: Self-pay | Admitting: *Deleted

## 2018-09-04 LAB — CBC
HCT: 34 % — ABNORMAL LOW (ref 36.0–46.0)
Hemoglobin: 11.4 g/dL — ABNORMAL LOW (ref 12.0–15.0)
MCH: 31.1 pg (ref 26.0–34.0)
MCHC: 33.5 g/dL (ref 30.0–36.0)
MCV: 92.6 fL (ref 80.0–100.0)
PLATELETS: 123 10*3/uL — AB (ref 150–400)
RBC: 3.67 MIL/uL — ABNORMAL LOW (ref 3.87–5.11)
RDW: 13.5 % (ref 11.5–15.5)
WBC: 11.7 10*3/uL — ABNORMAL HIGH (ref 4.0–10.5)

## 2018-09-04 LAB — RPR: RPR: NONREACTIVE

## 2018-09-04 MED ORDER — DIPHENHYDRAMINE HCL 25 MG PO CAPS: 25.0000 mg | ORAL_CAPSULE | Freq: Four times a day (QID) | ORAL | Status: DC | PRN

## 2018-09-04 MED ORDER — BENZOCAINE-MENTHOL 20-0.5 % EX AERO: 1.0000 "application " | INHALATION_SPRAY | CUTANEOUS | Status: DC | PRN

## 2018-09-04 MED ORDER — PRENATAL MULTIVITAMIN CH
1.0000 | ORAL_TABLET | Freq: Every day | ORAL | Status: DC
  Administered 2018-09-04 – 2018-09-05 (×2): 1 via ORAL
  Filled 2018-09-04 (×2): qty 1

## 2018-09-04 MED ORDER — COCONUT OIL OIL: 1.0000 "application " | TOPICAL_OIL | Status: DC | PRN

## 2018-09-04 MED ORDER — WITCH HAZEL-GLYCERIN EX PADS: 1.0000 "application " | MEDICATED_PAD | CUTANEOUS | Status: DC | PRN

## 2018-09-04 MED ORDER — SENNOSIDES-DOCUSATE SODIUM 8.6-50 MG PO TABS
2.0000 | ORAL_TABLET | ORAL | Status: DC
  Administered 2018-09-04: 2 via ORAL
  Filled 2018-09-04: qty 2

## 2018-09-04 MED ORDER — ZOLPIDEM TARTRATE 5 MG PO TABS: 5.0000 mg | ORAL_TABLET | Freq: Every evening | ORAL | Status: DC | PRN

## 2018-09-04 MED ORDER — ACETAMINOPHEN 325 MG PO TABS: 650.0000 mg | ORAL_TABLET | ORAL | Status: DC | PRN

## 2018-09-04 MED ORDER — ONDANSETRON HCL 4 MG/2ML IJ SOLN: 4.0000 mg | INTRAMUSCULAR | Status: DC | PRN

## 2018-09-04 MED ORDER — DIBUCAINE 1 % RE OINT: 1.0000 "application " | TOPICAL_OINTMENT | RECTAL | Status: DC | PRN

## 2018-09-04 MED ORDER — SIMETHICONE 80 MG PO CHEW: 80.0000 mg | CHEWABLE_TABLET | ORAL | Status: DC | PRN

## 2018-09-04 MED ORDER — TETANUS-DIPHTH-ACELL PERTUSSIS 5-2.5-18.5 LF-MCG/0.5 IM SUSP: 0.5000 mL | Freq: Once | INTRAMUSCULAR | Status: DC

## 2018-09-04 MED ORDER — IBUPROFEN 600 MG PO TABS
600.0000 mg | ORAL_TABLET | Freq: Four times a day (QID) | ORAL | Status: DC
  Administered 2018-09-04 – 2018-09-05 (×6): 600 mg via ORAL
  Filled 2018-09-04 (×6): qty 1

## 2018-09-04 MED ORDER — ONDANSETRON HCL 4 MG PO TABS: 4.0000 mg | ORAL_TABLET | ORAL | Status: DC | PRN

## 2018-09-04 NOTE — Lactation Note (Signed)
This note was copied from a baby's chart. Lactation Consultation Note  Patient Name: Lauren Burgess [id number] Date: 09/04/2018 Reason for consult: Initial assessment;Term  P3 mom, baby is 49 hours old. Mom has successfully breastfed 2 other children, now 67 & 2.5, for one year each. 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 Per mom, baby was latched incorrectly at birth but she didn't want to interrupt the first feeding, states she thinks he was only on the tip of the nipple. Mom states she has been alternating breasts using the cross-cradle position and has been trying to make sure baby's bottom lip is flanged out during feedings.  Mom states baby just fed for about 5 minutes before falling asleep at the breast. Mom tried to wake infant back up, but unsuccessful. Infant currently lying on mom's clothed chest.  Discussed feeding with cues, at least 8-12 times in 24 hour period. Reviewed hand expression, breast massage, alternating positions and breasts with feedings. Mom states she is most comfortable with cross cradle position, encouraged to try football hold. Encouraged STS as much as possible. Reinforced flanged lips, wide angle at the corner of the mouth, and deep latch to include areola.   Mom requests pump kit to have spare pieces, as she uses the [employer] in her pump room at the pharmacy. Mom states she still has her pump from her last child and knows she is eligible for an employee pump, but unsure if she wants a new one at this time. Encouraged mom to notify [place] if she changes her mind, DEBP kit provided. Mom states she used the 24m flange with her other babies and knows how to use the pump. Reminded mom about cleaning kit parts and sterilization once daily.  Lactation brochure with phone number, list of resources, and extra I &O sheet provided; informed mom of IP/OP LC services and BF support group.  Encouraged to call with any concerns.  Consult Status Consult Status:  Follow-up Date: 09/05/18 Follow-up type: In-patient    CCranston Neighbor2/18/2020, 11:51 AM

## 2018-09-04 NOTE — Progress Notes (Signed)
Patient ID: Lauren Burgess, female   DOB: August 14, 1985, 33 y.o.   MRN: [id number] PPD # 1 S/P NSVD  Live born female  Birth Weight: 7 lb 7.9 oz (3399 g) APGAR: 9, 9  Newborn Delivery   Birth date/time:  09/03/2018 23:08:00 Delivery type:  Vaginal, Spontaneous    Baby name: Lauren Burgess Delivering provider: MODY, VAISHALI  Episiotomy:None   Lacerations:1st degree   circumcision planned inpatient  Feeding: breast  Pain control at delivery: Epidural   S:  Reports feeling tired but well.             Tolerating po/ No nausea or vomiting             Bleeding is light             Pain controlled with ibuprofen (OTC)             Up ad lib / ambulatory / voiding without difficulties   O:  A & O x 3, in no apparent distress              VS:  Vitals:   09/04/18 0046 09/04/18 0130 09/04/18 0229 09/04/18 0623  BP: 98/63 113/68 109/67 115/80  Pulse: (!) 53 (!) 52 (!) 52 70  Resp:  18 18 18   Temp:  98 F (36.7 C) 98.1 F (36.7 C) 98.6 F (37 C)  TempSrc:  Oral Oral Oral  SpO2:      Weight:      Height:        LABS:  Recent Labs    09/03/18 1333 09/04/18 0046  WBC 9.0 11.7*  HGB 13.3 11.4*  HCT 39.5 34.0*  PLT 139* 123*    Blood type: --/--/O POS (02/17 1333)  Rubella: Immune (08/02 0000)   I&O: I/O last 3 completed shifts: In: -  Out: 508 [Urine:450; Blood:58]          No intake/output data recorded.  Vaccines: TDaP UTD         Flu    UTD   Lungs: Clear and unlabored  Heart: regular rate and rhythm / no murmurs  Abdomen: soft, non-tender, non-distended             Fundus: firm, non-tender, U-1  Perineum: repair intact  Lochia: small  Extremities: no edema, no calf pain or tenderness    A/P: PPD # 1 33 y.o., [id number]   Principal Problem:   Postpartum care following vaginal delivery (2/17) Active Problems:   SVD (spontaneous vaginal delivery)   Perineal laceration with delivery, first degree   Doing well - stable status  Routine post partum orders  Anticipate  discharge tomorrow    [name], MSN, CNM 09/04/2018, 9:54 AM

## 2018-09-04 NOTE — Anesthesia Postprocedure Evaluation (Signed)
Anesthesia Post Note  Patient: Lauren Burgess  Procedure(s) Performed: AN AD [place]     Patient location during evaluation: Mother Baby Anesthesia Type: Epidural Level of consciousness: awake and alert, awake and oriented Pain management: pain level controlled Vital Signs Assessment: post-procedure vital signs reviewed and stable Respiratory status: spontaneous breathing Cardiovascular status: blood pressure returned to baseline Postop Assessment: no headache, no backache, epidural receding, patient able to bend at knees, no apparent nausea or vomiting, adequate PO intake and able to ambulate Anesthetic complications: no    Last Vitals:  Vitals:   09/04/18 0229 09/04/18 0623  BP: 109/67 115/80  Pulse: (!) 52 70  Resp: 18 18  Temp: 36.7 C 37 C  SpO2:      Last Pain:  Vitals:   09/04/18 0623  TempSrc: Oral  PainSc: 0-No pain   Pain Goal: Patients Stated Pain Goal: 8 (09/03/18 1349)                 Bufford Spikes

## 2018-09-05 MED ORDER — IBUPROFEN 600 MG PO TABS: 600.0000 mg | ORAL_TABLET | Freq: Four times a day (QID) | ORAL | 0 refills | Status: AC

## 2018-09-05 NOTE — Lactation Note (Signed)
This note was copied from a baby's chart. Lactation Consultation Note  Patient Name: Boy Aleysia Oltmann [id number] Date: 09/05/2018 Reason for consult: Follow-up assessment Baby is 35 hours old/7% weight loss.  Mom is an experienced breastfeeding mom and feels newborn is feeding well.  At times she needs to relatch baby.  Discussed milk coming to volume and the prevention and treatment of engorgement.  Mom has a DEBP at home.  Lactation outpatient services and support information reviewed and encouraged prn.  Maternal Data    Feeding Feeding Type: Breast Fed  LATCH Score Latch: Grasps breast easily, tongue down, lips flanged, rhythmical sucking.  Audible Swallowing: A few with stimulation  Type of Nipple: Everted at rest and after stimulation  Comfort (Breast/Nipple): Filling, red/small blisters or bruises, mild/mod discomfort  Hold (Positioning): No assistance needed to correctly position infant at breast.  LATCH Score: 8  Interventions    Lactation Tools Discussed/Used     Consult Status Consult Status: Complete Follow-up type: Call as needed    Ave Filter 09/05/2018, 10:24 AM

## 2018-09-05 NOTE — Discharge Summary (Signed)
Obstetric Discharge Summary   Patient Name: Lauren Burgess DOB: 12-06-1985 MRN: [id number]  Date of Admission: 09/03/2018 Date of Discharge: 09/05/2018 Date of Delivery: 09/03/2018 Gestational Age at Delivery: [redacted]w[redacted]d  Primary OB: [facility] - Dr. Pamala Hurry   Antepartum complications:  Echogenic bowel at 19 wks. TORCH, Toxo, CF screen neg. Echogenoc bowel resolved at 23 wks. Growth AGA at 31 wks. 3'15" at 61%  - GBS positive  - Raynauds  Prenatal Labs:  ABO, Rh:  O(+) Antibody:  Neg  Rubella: Immune (08/02 0000) RPR: Nonreactive (08/02 0000)  HBsAg: Negative (08/02 0000)  HIV: Non-reactive (08/02 0000)  GBS: Positive (01/25 0000)   Admitting Diagnosis: Active labor at 39 weeks  Secondary Diagnoses: Patient Active Problem List   Diagnosis Date Noted  . SVD (spontaneous vaginal delivery) 09/04/2018  . Perineal laceration with delivery, first degree 09/04/2018  . Family history of combined hyperlipidemia 12/28/2017  . Postpartum care following vaginal delivery (2/17) 02/03/2016    Augmentation: AROM, Pitocin  Complications: None  Date of Delivery: 09/03/2018 Delivered By: Dr. Benjie Karvonen Delivery Type: spontaneous vaginal delivery Anesthesia: epidural Placenta: sponatneous Laceration: 1st degree  Episiotomy: none  Newborn Data: Live born female  Birth Weight: 7 lb 7.9 oz (3399 g) APGAR: 9, 9  Newborn Delivery   Birth date/time:  09/03/2018 23:08:00 Delivery type:  Vaginal, Spontaneous        Hospital/Postpartum Course  (Vaginal Delivery): Pt. Admitted for early labor at 37 weeks. GBS positive and hx of rapid labors. She progressed with Pitocin and AROM.  See notes and delivery summary for details. Patient had an uncomplicated postpartum course.  By time of discharge on PPD#2, her pain was controlled on oral pain medications; she had appropriate lochia and was ambulating, voiding without difficulty and tolerating regular diet.  She was deemed stable for discharge to  home.    Labs: CBC Latest Ref Rng & Units 09/04/2018 09/03/2018 09/01/2016  WBC 4.0 - 10.5 K/uL 11.7(H) 9.0 6.3  Hemoglobin 12.0 - 15.0 g/dL 11.4(L) 13.3 12.2  Hematocrit 36.0 - 46.0 % 34.0(L) 39.5 36.9  Platelets 150 - 400 K/uL 123(L) 139(L) 248   O POS  Physical exam:  BP 99/76 (BP Location: Right Arm)   Pulse 63   Temp 97.8 F (36.6 C) (Oral)   Resp 18   Ht 5' 3.5" (1.613 m)   Wt 59 kg   SpO2 98%   Breastfeeding Unknown   BMI 22.67 kg/m  General: alert and no distress Pulm: normal respiratory effort Lochia: appropriate Abdomen: soft, NT, Uterine Fundus: firm, below umbilicus Perineum: healing well, no significant erythema, no significant edema Extremities: No evidence of DVT seen on physical exam. No lower extremity edema.   Disposition: stable, discharge to home Baby Feeding: breast milk Baby Disposition: home with mom  Contraception: not discussed  Rh Immune globulin given: N/A Rubella vaccine given: N/A Tdap vaccine given in AP or PP setting: UTD 2019 Flu vaccine given in AP or PP setting: UTD 2019   Plan:  ZURRI RUDDEN was discharged to home in good condition. Follow-up appointment at [facility] in 6 weeks.  Discharge Instructions: Per After Visit Summary. Refer to After Visit Summary and [facility] discharge booklet  Activity: Advance as tolerated. Pelvic rest for 6 weeks.   Diet: Regular, Heart Healthy Discharge Medications: Allergies as of 09/05/2018   No Known Allergies     Medication List    TAKE these medications   acetaminophen 500 MG tablet Commonly known as:  TYLENOL Take 1,000 mg by mouth every 6 (six) hours as needed for headache. What changed:  Another medication with the same name was removed. Continue taking this medication, and follow the directions you see here.   docusate sodium 100 MG capsule Commonly known as:  COLACE Take 100 mg by mouth daily.   ibuprofen 600 MG tablet Commonly known as:  ADVIL,MOTRIN Take  1 tablet (600 mg total) by mouth every 6 (six) hours.   prenatal multivitamin Tabs tablet Take 1 tablet by mouth daily at 12 noon.            Discharge Care Instructions  (From admission, onward)         Start     Ordered   09/05/18 0000  Discharge wound care:    Comments:  Warm water sitz baths 3 times daily as needed for repair   09/05/18 1000         Outpatient follow up:  Follow-up Information    Aloha Gell, MD. Schedule an appointment as soon as possible for a visit in 6 week(s).   Specialty:  Obstetrics and Gynecology Why:  Postpartum visit  Contact information: [address] [place] [address] [phone]           Signed:  Lars Pinks, MSN, CNM [facility]

## 2018-09-05 NOTE — Progress Notes (Addendum)
PPD #2, SVD, 2nd degree repair, baby boy "Lauren Burgess"  S:  Reports feeling good, tired, ready to go home today              Tolerating po/ No nausea or vomiting / Denies dizziness or SOB             Bleeding is light             Pain controlled with Motrin              Up ad lib / ambulatory / voiding QS  Newborn breast feeding - going well; reports some nipple soreness, but expressing colostrum for healing, denies bleeding/cracks  / Circumcision - completed 2/18  Reports hx of diastasis recti - wants to resume core exercises at 2 weeks PP  O:               VS: BP 99/76 (BP Location: Right Arm)   Pulse 63   Temp 97.8 F (36.6 C) (Oral)   Resp 18   Ht 5' 3.5" (1.613 m)   Wt 59 kg   SpO2 98%   Breastfeeding Unknown   BMI 22.67 kg/m    LABS:              Recent Labs    09/03/18 1333 09/04/18 0046  WBC 9.0 11.7*  HGB 13.3 11.4*  PLT 139* 123*               Blood type: --/--/O POS (02/17 1333)  Rubella: Immune (08/02 0000)                     I&O: Intake/Output      02/18 0701 - 02/19 0700 02/19 0701 - 02/20 0700   Urine (mL/kg/hr)     Blood     Total Output     Net                        Physical Exam:             Alert and oriented X3  Lungs: Clear and unlabored  Heart: regular rate and rhythm / no murmurs  Abdomen: soft, non-tender, non-distended              Fundus: firm, non-tender, U-3  Perineum: well approximated 1st degree, no edema, no ecchymosis, no erythema   Lochia: small  Extremities: no edema, no calf pain or tenderness    A: PPD # 2, SVD  1st degree repair   Gestational thrombocytopenia - plts stable    Diastasis recti  Doing well - stable status  P: Routine post partum orders  Discharge home today  Instructions and warning s/s reviewed   Warm water sitz baths PRN  Recommended following exercises for DR - also discussed pelvic floor PT referral at 6 weeks PP visit if desires   F/u with Dr. Pamala Hurry in 6 weeks   Lars Pinks, MSN,  [facility]

## 2018-10-25 IMAGING — DX DG CHEST 2V
2 series · 2 of 2 positions shown · non-contrast
Comparison: No recent prior .

CLINICAL DATA: Cough.

EXAM:
CHEST  2 VIEW

[chest pa]
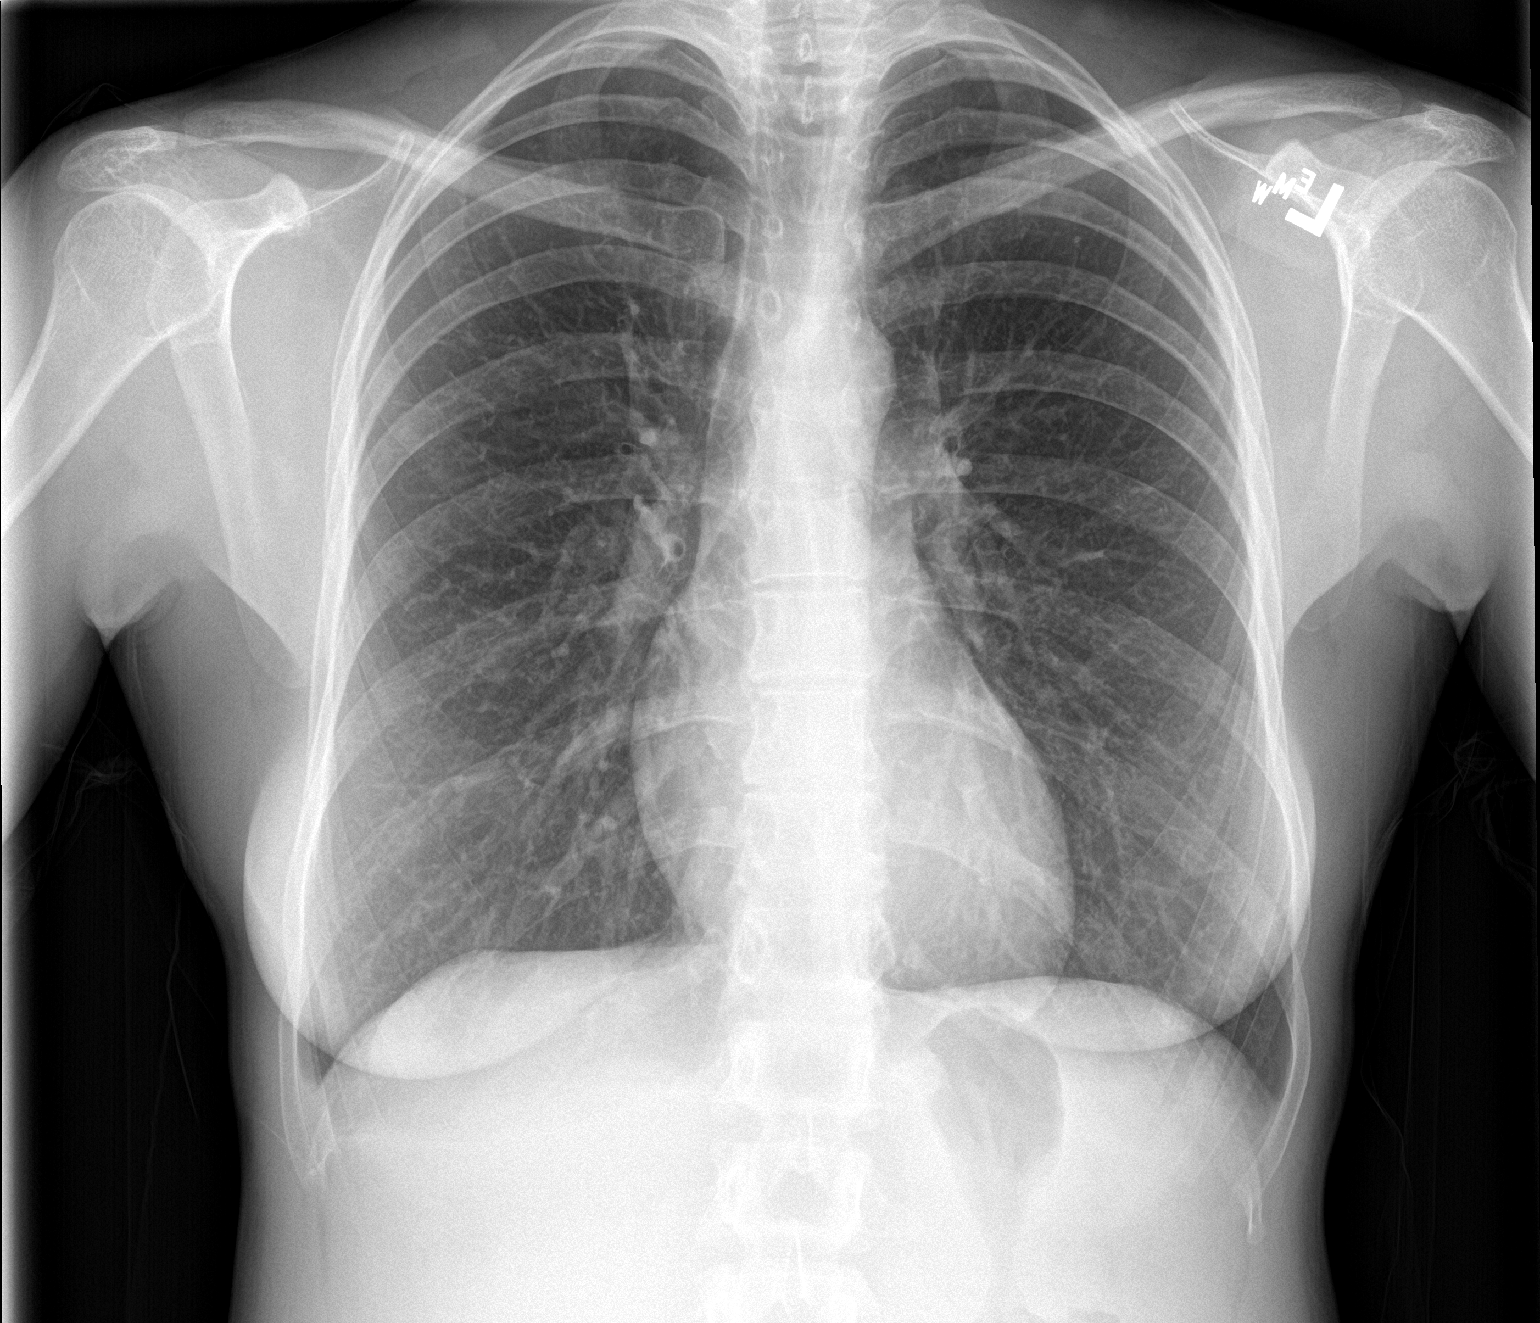

[chest lat]
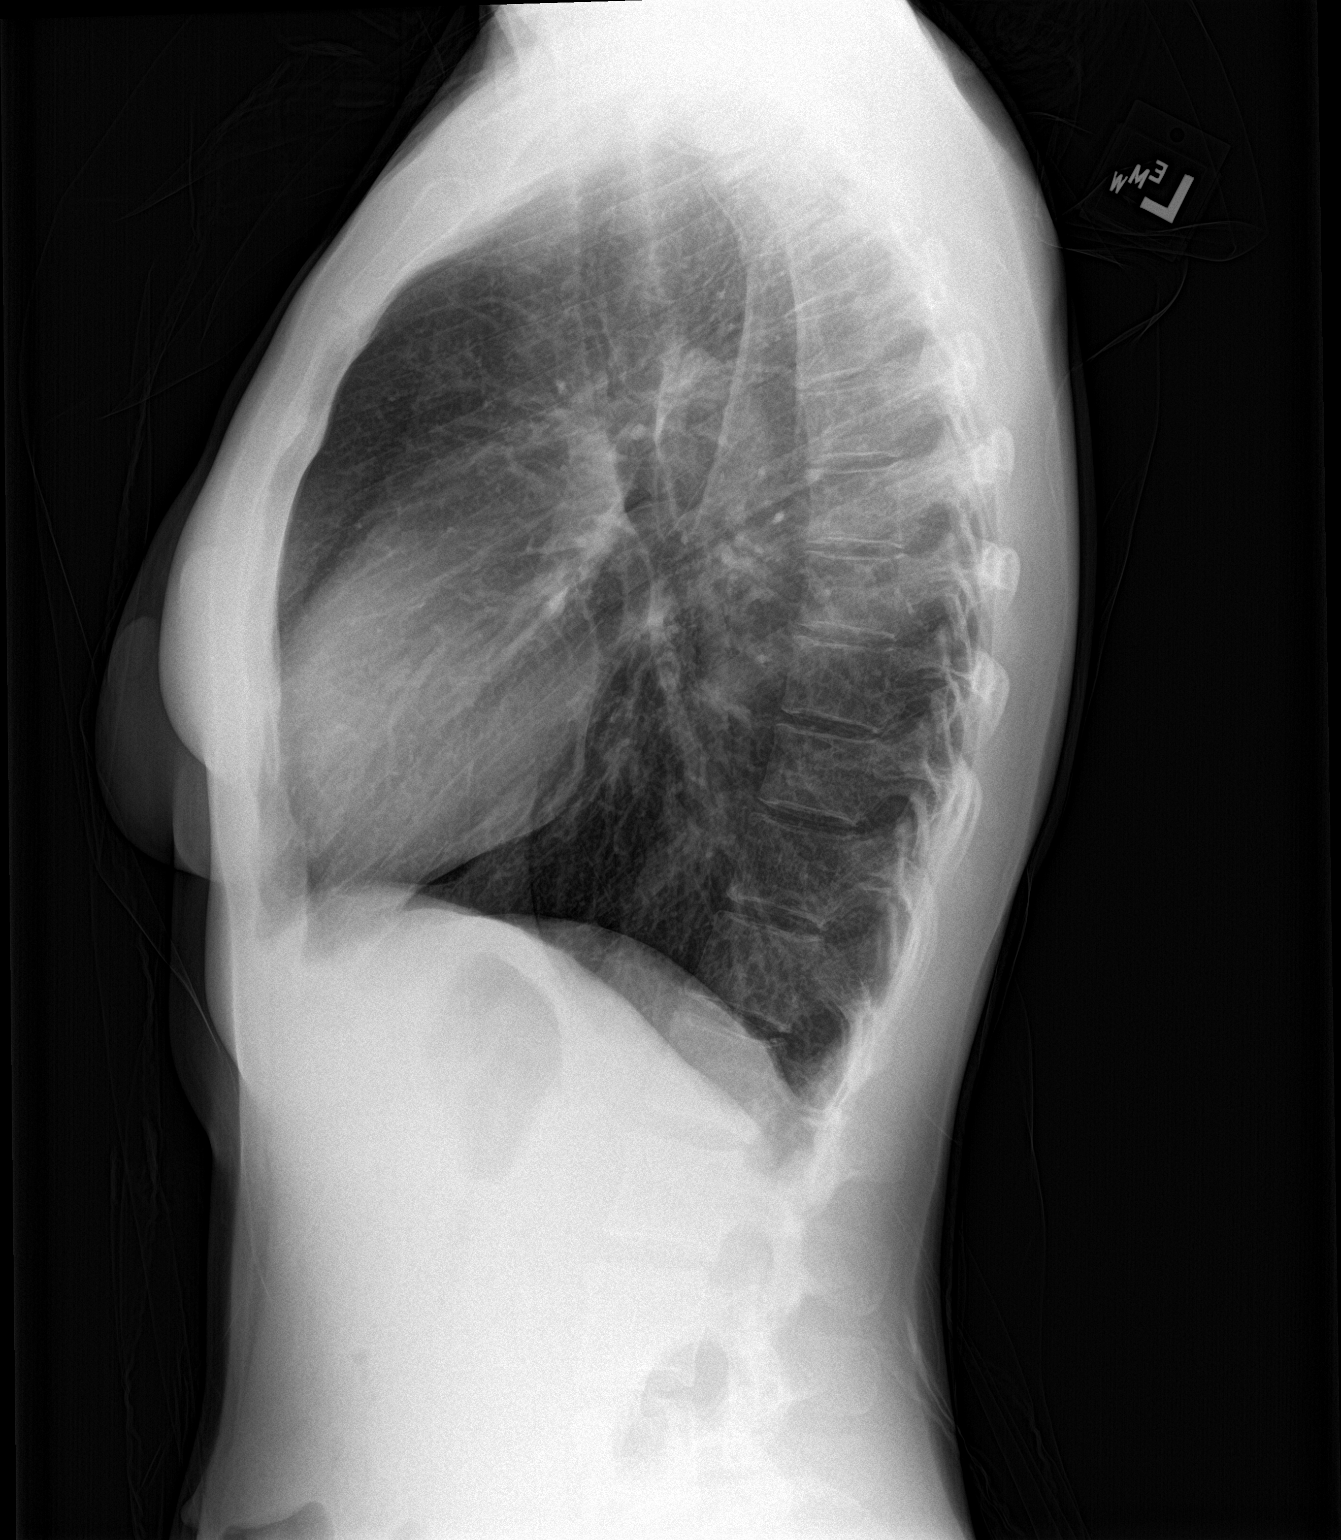

[2 of 2 positions shown; findings below may reference images not displayed]

FINDINGS: Mediastinum and hilar structures normal. Lungs are clear. No pleural
effusion or pneumothorax. Heart size normal.
IMPRESSION: No acute abnormality.

## 2018-11-14 ENCOUNTER — Encounter: Payer: Self-pay | Admitting: Family Medicine

## 2018-11-27 ENCOUNTER — Encounter: Payer: Self-pay | Admitting: Family Medicine

## 2018-11-27 NOTE — Progress Notes (Signed)
Received Pap smear report from OB/GYN. Date of service January 01, 2018. Negative for intraepithelial lesion or malignancy. HPV cotesting negative.

## 2019-01-24 ENCOUNTER — Encounter: Payer: Self-pay | Admitting: Family Medicine

## 2019-08-15 ENCOUNTER — Encounter: Payer: Self-pay | Admitting: Family Medicine

## 2019-12-24 ENCOUNTER — Ambulatory Visit (INDEPENDENT_AMBULATORY_CARE_PROVIDER_SITE_OTHER): Payer: No Typology Code available for payment source | Admitting: Family Medicine

## 2019-12-24 ENCOUNTER — Other Ambulatory Visit: Payer: Self-pay

## 2019-12-24 ENCOUNTER — Encounter: Payer: Self-pay | Admitting: Family Medicine

## 2019-12-24 VITALS — BP 125/86 | HR 73 | Temp 98.5°F | Ht 63.39 in | Wt 109.0 lb

## 2019-12-24 DIAGNOSIS — Z0000 Encounter for general adult medical examination without abnormal findings: Secondary | ICD-10-CM | POA: Diagnosis not present

## 2019-12-24 DIAGNOSIS — D485 Neoplasm of uncertain behavior of skin: Secondary | ICD-10-CM | POA: Diagnosis not present

## 2019-12-24 DIAGNOSIS — Z13220 Encounter for screening for lipoid disorders: Secondary | ICD-10-CM

## 2019-12-24 NOTE — Assessment & Plan Note (Signed)
Well adult Orders Placed This Encounter  Procedures  . COMPLETE METABOLIC PANEL WITH GFR  . Lipid Profile  . CBC  . Ambulatory referral to Dermatology    Referral Priority:   Routine    Referral Type:   Consultation    Referral Reason:   Specialty Services Required    Requested Specialty:   Dermatology    Number of Visits Requested:   1  Screening:  Lipid panel. Referral to dermatology for skin screening per patient request.  Immunizations: UTD Anticipatory guidance/Risk factor reduction:  Recommendations per AVS

## 2019-12-24 NOTE — Patient Instructions (Addendum)
Great to meet you today!  You can have labs completed at [facility].  I have entered dermatology referral as well.  You should be contacted for appointment.    Preventive Care 20-35 Years Old, Female Preventive care refers to visits with your health care provider and lifestyle choices that can promote health and wellness. This includes:  A yearly physical exam. This may also be called an annual well check.  Regular dental visits and eye exams.  Immunizations.  Screening for certain conditions.  Healthy lifestyle choices, such as eating a healthy diet, getting regular exercise, not using drugs or products that contain nicotine and tobacco, and limiting alcohol use. What can I expect for my preventive care visit? Physical exam Your health care provider will check your:  Height and weight. This may be used to calculate body mass index (BMI), which tells if you are at a healthy weight.  Heart rate and blood pressure.  Skin for abnormal spots. Counseling Your health care provider may ask you questions about your:  Alcohol, tobacco, and drug use.  Emotional well-being.  Home and relationship well-being.  Sexual activity.  Eating habits.  Work and work Statistician.  Method of birth control.  Menstrual cycle.  Pregnancy history. What immunizations do I need?  Influenza (flu) vaccine  This is recommended every year. Tetanus, diphtheria, and pertussis (Tdap) vaccine  You may need a Td booster every 10 years. Varicella (chickenpox) vaccine  You may need this if you have not been vaccinated. Human papillomavirus (HPV) vaccine  If recommended by your health care provider, you may need three doses over 6 months. Measles, mumps, and rubella (MMR) vaccine  You may need at least one dose of MMR. You may also need a second dose. Meningococcal conjugate (MenACWY) vaccine  One dose is recommended if you are age 67-21 years and a first-year college student living in a  residence hall, or if you have one of several medical conditions. You may also need additional booster doses. Pneumococcal conjugate (PCV13) vaccine  You may need this if you have certain conditions and were not previously vaccinated. Pneumococcal polysaccharide (PPSV23) vaccine  You may need one or two doses if you smoke cigarettes or if you have certain conditions. Hepatitis A vaccine  You may need this if you have certain conditions or if you travel or work in places where you may be exposed to hepatitis A. Hepatitis B vaccine  You may need this if you have certain conditions or if you travel or work in places where you may be exposed to hepatitis B. Haemophilus influenzae type b (Hib) vaccine  You may need this if you have certain conditions. You may receive vaccines as individual doses or as more than one vaccine together in one shot (combination vaccines). Talk with your health care provider about the risks and benefits of combination vaccines. What tests do I need?  Blood tests  Lipid and cholesterol levels. These may be checked every 5 years starting at age 35.  Hepatitis C test.  Hepatitis B test. Screening  Diabetes screening. This is done by checking your blood sugar (glucose) after you have not eaten for a while (fasting).  Sexually transmitted disease (STD) testing.  BRCA-related cancer screening. This may be done if you have a family history of breast, ovarian, tubal, or peritoneal cancers.  Pelvic exam and Pap test. This may be done every 3 years starting at age 27. Starting at age 65, this may be done every 5 years if  you have a Pap test in combination with an HPV test. Talk with your health care provider about your test results, treatment options, and if necessary, the need for more tests. Follow these instructions at home: Eating and drinking   Eat a diet that includes fresh fruits and vegetables, whole grains, lean protein, and low-fat dairy.  Take vitamin  and mineral supplements as recommended by your health care provider.  Do not drink alcohol if: ? Your health care provider tells you not to drink. ? You are pregnant, may be pregnant, or are planning to become pregnant.  If you drink alcohol: ? Limit how much you have to 0-1 drink a day. ? Be aware of how much alcohol is in your drink. In the [place] one drink equals one 12 oz bottle of beer (355 mL), one 5 oz glass of wine (148 mL), or one 1 oz glass of hard liquor (44 mL). Lifestyle  Take daily care of your teeth and gums.  Stay active. Exercise for at least 30 minutes on 5 or more days each week.  Do not use any products that contain nicotine or tobacco, such as cigarettes, e-cigarettes, and chewing tobacco. If you need help quitting, ask your health care provider.  If you are sexually active, practice safe sex. Use a condom or other form of birth control (contraception) in order to prevent pregnancy and STIs (sexually transmitted infections). If you plan to become pregnant, see your health care provider for a preconception visit. What's next?  Visit your health care provider once a year for a well check visit.  Ask your health care provider how often you should have your eyes and teeth checked.  Stay up to date on all vaccines. This information is not intended to replace advice given to you by your health care provider. Make sure you discuss any questions you have with your health care provider. Document Revised: 03/15/2018 Document Reviewed: 03/15/2018 Elsevier Patient Education  2020 Elsevier Inc.  
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
## 2019-12-26 ENCOUNTER — Telehealth: Payer: Self-pay | Admitting: Physician Assistant

## 2019-12-26 NOTE — Telephone Encounter (Signed)
Patient called to schedule referral appointment-referral from Dr. Luetta Nutting.  Scheduled for 04/13/2020 @ 3:00 with Anderson Malta Clark-Bruning,PA-C.

## 2019-12-27 LAB — COMPLETE METABOLIC PANEL WITH GFR
AG Ratio: 2.1 (calc) (ref 1.0–2.5)
ALT: 11 U/L (ref 6–29)
AST: 16 U/L (ref 10–30)
Albumin: 4.4 g/dL (ref 3.6–5.1)
Alkaline phosphatase (APISO): 57 U/L (ref 31–125)
BUN: 16 mg/dL (ref 7–25)
CO2: 29 mmol/L (ref 20–32)
Calcium: 9.1 mg/dL (ref 8.6–10.2)
Chloride: 106 mmol/L (ref 98–110)
Creat: 0.82 mg/dL (ref 0.50–1.10)
GFR, Est African American: 107 mL/min/{1.73_m2} (ref >=60)
GFR, Est Non African American: 93 mL/min/{1.73_m2} (ref >=60)
Globulin: 2.1 g/dL (calc) (ref 1.9–3.7)
Glucose, Bld: 90 mg/dL (ref 65–99)
Potassium: 4.4 mmol/L (ref 3.5–5.3)
Sodium: 140 mmol/L (ref 135–146)
Total Bilirubin: 0.6 mg/dL (ref 0.2–1.2)
Total Protein: 6.5 g/dL (ref 6.1–8.1)

## 2019-12-27 LAB — CBC
HCT: 36.6 % (ref 35.0–45.0)
Hemoglobin: 12.4 g/dL (ref 11.7–15.5)
MCH: 31.3 pg (ref 27.0–33.0)
MCHC: 33.9 g/dL (ref 32.0–36.0)
MCV: 92.4 fL (ref 80.0–100.0)
MPV: 12.6 fL — ABNORMAL HIGH (ref 7.5–12.5)
Platelets: 172 10*3/uL (ref 140–400)
RBC: 3.96 10*6/uL (ref 3.80–5.10)
RDW: 12.8 % (ref 11.0–15.0)
WBC: 4.2 10*3/uL (ref 3.8–10.8)

## 2019-12-27 LAB — LIPID PANEL
Cholesterol: 153 mg/dL (ref <200)
HDL: 55 mg/dL (ref >=50)
LDL Cholesterol (Calc): 84 mg/dL (calc)
Non-HDL Cholesterol (Calc): 98 mg/dL (calc) (ref <130)
Total CHOL/HDL Ratio: 2.8 (calc) (ref <5.0)
Triglycerides: 67 mg/dL (ref <150)

## 2020-04-13 ENCOUNTER — Encounter: Payer: Self-pay | Admitting: Physician Assistant

## 2020-04-13 ENCOUNTER — Ambulatory Visit (INDEPENDENT_AMBULATORY_CARE_PROVIDER_SITE_OTHER): Payer: No Typology Code available for payment source | Admitting: Physician Assistant

## 2020-04-13 ENCOUNTER — Other Ambulatory Visit: Payer: Self-pay

## 2020-04-13 DIAGNOSIS — Z1283 Encounter for screening for malignant neoplasm of skin: Secondary | ICD-10-CM | POA: Diagnosis not present

## 2020-04-13 DIAGNOSIS — L814 Other melanin hyperpigmentation: Secondary | ICD-10-CM

## 2020-04-13 DIAGNOSIS — L821 Other seborrheic keratosis: Secondary | ICD-10-CM

## 2020-04-13 DIAGNOSIS — I781 Nevus, non-neoplastic: Secondary | ICD-10-CM | POA: Diagnosis not present
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
## 2020-12-17 ENCOUNTER — Other Ambulatory Visit (HOSPITAL_COMMUNITY): Payer: Self-pay

## 2020-12-17 MED ORDER — CARESTART COVID-19 HOME TEST VI KIT
PACK | 0 refills | Status: DC
  Filled 2020-12-17: qty 4, 4d supply, fill #0

## 2021-07-26 ENCOUNTER — Telehealth: Payer: No Typology Code available for payment source | Admitting: Physician Assistant

## 2021-07-26 DIAGNOSIS — J208 Acute bronchitis due to other specified organisms: Secondary | ICD-10-CM

## 2021-07-26 MED ORDER — PREDNISONE 10 MG (21) PO TBPK: ORAL_TABLET | ORAL | 0 refills | Status: DC

## 2021-07-26 MED ORDER — IPRATROPIUM BROMIDE 0.03 % NA SOLN: 2.0000 | Freq: Two times a day (BID) | NASAL | 0 refills | Status: DC

## 2021-07-26 MED ORDER — BENZONATATE 100 MG PO CAPS: 100.0000 mg | ORAL_CAPSULE | Freq: Three times a day (TID) | ORAL | 0 refills | Status: DC | PRN

## 2021-07-26 NOTE — Progress Notes (Signed)
We are sorry that you are not feeling well.  Here is how we plan to help!  Based on your presentation I believe you most likely have A cough due to a virus.  This is called viral bronchitis and is best treated by rest, plenty of fluids and control of the cough.  You may use Ibuprofen or Tylenol as directed to help your symptoms.     In addition you may use A prescription cough medication called Tessalon Perles 100mg . You may take 1-2 capsules every 8 hours as needed for your cough.  Ipratropium nasal spray for the nasal congestion and drainage  Prednisone 10 mg daily for 6 days (see taper instructions below)  Directions for 6 day taper: Day 1: 2 tablets before breakfast, 1 after both lunch & dinner and 2 at bedtime Day 2: 1 tab before breakfast, 1 after both lunch & dinner and 2 at bedtime Day 3: 1 tab at each meal & 1 at bedtime Day 4: 1 tab at breakfast, 1 at lunch, 1 at bedtime Day 5: 1 tab at breakfast & 1 tab at bedtime Day 6: 1 tab at breakfast  From your responses in the eVisit questionnaire you describe inflammation in the upper respiratory tract which is causing a significant cough.  This is commonly called Bronchitis and has four common causes:   Allergies Viral Infections Acid Reflux Bacterial Infection Allergies, viruses and acid reflux are treated by controlling symptoms or eliminating the cause. An example might be a cough caused by taking certain blood pressure medications. You stop the cough by changing the medication. Another example might be a cough caused by acid reflux. Controlling the reflux helps control the cough.  USE OF BRONCHODILATOR ("RESCUE") INHALERS: There is a risk from using your bronchodilator too frequently.  The risk is that over-reliance on a medication which only relaxes the muscles surrounding the breathing tubes can reduce the effectiveness of medications prescribed to reduce swelling and congestion of the tubes themselves.  Although you feel brief  relief from the bronchodilator inhaler, your asthma may actually be worsening with the tubes becoming more swollen and filled with mucus.  This can delay other crucial treatments, such as oral steroid medications. If you need to use a bronchodilator inhaler daily, several times per day, you should discuss this with your provider.  There are probably better treatments that could be used to keep your asthma under control.     HOME CARE Only take medications as instructed by your medical team. Complete the entire course of an antibiotic. Drink plenty of fluids and get plenty of rest. Avoid close contacts especially the very young and the elderly Cover your mouth if you cough or cough into your sleeve. Always remember to wash your hands A steam or ultrasonic humidifier can help congestion.   GET HELP RIGHT AWAY IF: You develop worsening fever. You become short of breath You cough up blood. Your symptoms persist after you have completed your treatment plan MAKE SURE YOU  Understand these instructions. Will watch your condition. Will get help right away if you are not doing well or get worse.    Thank you for choosing an e-visit.  Your e-visit answers were reviewed by a board certified advanced clinical practitioner to complete your personal care plan. Depending upon the condition, your plan could have included both over the counter or prescription medications.  Please review your pharmacy choice. Make sure the pharmacy is open so you can pick up prescription now.  If there is a problem, you may contact your provider through [employer] and have the prescription routed to another pharmacy.  Your safety is important to [place]. If you have drug allergies check your prescription carefully.   For the next 24 hours you can use MyChart to ask questions about today's visit, request a non-urgent call back, or ask for a work or school excuse. You will get an email in the next two days asking about  your experience. I hope that your e-visit has been valuable and will speed your recovery.  I provided 5 minutes of non face-to-face time during this encounter for chart review and documentation.

## 2021-08-20 ENCOUNTER — Other Ambulatory Visit (HOSPITAL_COMMUNITY): Payer: Self-pay

## 2023-01-27 ENCOUNTER — Other Ambulatory Visit: Payer: Self-pay | Admitting: Oncology

## 2023-01-27 DIAGNOSIS — Z006 Encounter for examination for normal comparison and control in clinical research program: Secondary | ICD-10-CM

## 2023-03-06 ENCOUNTER — Other Ambulatory Visit (HOSPITAL_COMMUNITY)
Admission: AD | Admit: 2023-03-06 | Discharge: 2023-03-06 | Disposition: A | Discharge status: Home / Self Care | Payer: 59 | Source: Ambulatory Visit | Attending: Oncology | Admitting: Oncology

## 2023-03-06 DIAGNOSIS — Z006 Encounter for examination for normal comparison and control in clinical research program: Secondary | ICD-10-CM | POA: Insufficient documentation

## 2023-03-15 LAB — GENECONNECT MOLECULAR SCREEN: Genetic Analysis Overall Interpretation: NEGATIVE

## 2023-05-01 NOTE — Progress Notes (Unsigned)
   Rubin Payor, PhD, LAT, ATC acting as a scribe for Lauren Graham, MD.  Lauren Burgess is a 37 y.o. female who presents to [facility] at [facility] today for R foot pain x ***. Pt is a runner ***. Pt locates pain to ***  Aggravates: Treatments tried:  Pertinent review of systems: ***  Relevant historical information: ***   Exam:  There were no vitals taken for this visit. General: Well Developed, well nourished, and in no acute distress.   MSK: ***    Lab and Radiology Results No results found for this or any previous visit (from the past 72 hour(s)). No results found.     Assessment and Plan: 37 y.o. female with ***   PDMP not reviewed this encounter. No orders of the defined types were placed in this encounter.  No orders of the defined types were placed in this encounter.    Discussed warning signs or symptoms. Please see discharge instructions. Patient expresses understanding.   ***

## 2023-05-02 ENCOUNTER — Other Ambulatory Visit: Payer: Self-pay

## 2023-05-02 ENCOUNTER — Ambulatory Visit: Payer: 59 | Admitting: Family Medicine

## 2023-05-02 ENCOUNTER — Encounter: Payer: Self-pay | Admitting: Family Medicine

## 2023-05-02 VITALS — BP 100/70 | HR 50 | Ht 63.39 in | Wt 110.0 lb

## 2023-05-02 DIAGNOSIS — M722 Plantar fascial fibromatosis: Secondary | ICD-10-CM | POA: Insufficient documentation

## 2023-05-02 DIAGNOSIS — M778 Other enthesopathies, not elsewhere classified: Secondary | ICD-10-CM | POA: Diagnosis not present

## 2023-05-02 DIAGNOSIS — M79671 Pain in right foot: Secondary | ICD-10-CM | POA: Diagnosis not present

## 2023-05-02 NOTE — Patient Instructions (Addendum)
Thank you for coming in today.   Please complete the exercises that the athletic trainer went over with you:  View at [email or web address] using code: [email or web address]  Ice massage  Night splint  Check back 1 month

## 2023-05-29 NOTE — Progress Notes (Unsigned)
   Lauren Payor, PhD, LAT, ATC acting as a scribe for Lauren Graham, MD.  Lauren Burgess is a 37 y.o. female who presents to [facility] at [facility] today for f/u R foot pain thought to be due to plantar fasciitis and flexor hallux longus tendinitis. Pt was last seen by Dr. Denyse Burgess on 05/02/23 and was taught HEP and advised to use heel cushion, ice massage, night splints, and Voltaren gel.   Today, pt reports ***  Pertinent review of systems: ***  Relevant historical information: ***   Exam:  There were no vitals taken for this visit. General: Well Developed, well nourished, and in no acute distress.   MSK: ***    Lab and Radiology Results No results found for this or any previous visit (from the past 72 hour(s)). No results found.     Assessment and Plan: 37 y.o. female with ***   PDMP not reviewed this encounter. No orders of the defined types were placed in this encounter.  No orders of the defined types were placed in this encounter.    Discussed warning signs or symptoms. Please see discharge instructions. Patient expresses understanding.   ***

## 2023-05-30 ENCOUNTER — Encounter: Payer: Self-pay | Admitting: Family Medicine

## 2023-05-30 ENCOUNTER — Ambulatory Visit: Payer: 59 | Admitting: Family Medicine

## 2023-05-30 VITALS — BP 106/72 | HR 55 | Ht 63.39 in | Wt 112.0 lb

## 2023-05-30 DIAGNOSIS — M722 Plantar fascial fibromatosis: Secondary | ICD-10-CM | POA: Diagnosis not present

## 2023-05-30 DIAGNOSIS — M778 Other enthesopathies, not elsewhere classified: Secondary | ICD-10-CM | POA: Diagnosis not present

## 2023-05-30 NOTE — Patient Instructions (Signed)
Thank you for coming in today.   Recheck as needed.   Advance as tolerated. Consider night splint if needed.

## 2023-08-22 DIAGNOSIS — Z1329 Encounter for screening for other suspected endocrine disorder: Secondary | ICD-10-CM | POA: Diagnosis not present

## 2023-08-22 DIAGNOSIS — Z13220 Encounter for screening for lipoid disorders: Secondary | ICD-10-CM | POA: Diagnosis not present

## 2023-08-22 DIAGNOSIS — Z1331 Encounter for screening for depression: Secondary | ICD-10-CM | POA: Diagnosis not present

## 2023-08-22 DIAGNOSIS — Z131 Encounter for screening for diabetes mellitus: Secondary | ICD-10-CM | POA: Diagnosis not present

## 2023-08-22 DIAGNOSIS — Z01419 Encounter for gynecological examination (general) (routine) without abnormal findings: Secondary | ICD-10-CM | POA: Diagnosis not present

## 2023-08-22 DIAGNOSIS — Z0000 Encounter for general adult medical examination without abnormal findings: Secondary | ICD-10-CM | POA: Diagnosis not present

## 2024-05-16 ENCOUNTER — Other Ambulatory Visit (HOSPITAL_COMMUNITY): Payer: Self-pay

## 2024-05-16 MED ORDER — FLUZONE 0.5 ML IM SUSY
0.5000 mL | PREFILLED_SYRINGE | Freq: Once | INTRAMUSCULAR | 0 refills | Status: AC
  Filled 2024-05-16: qty 0.5, 1d supply, fill #0
